# Patient Record
Sex: Male | Born: 2000 | Race: White | Hispanic: No | Marital: Single
Health system: Southern US, Community
[De-identification: ages and names within clinical notes are randomized; demographics above are authoritative.]

## PROBLEM LIST (undated history)

## (undated) DIAGNOSIS — F419 Anxiety disorder, unspecified: Secondary | ICD-10-CM

## (undated) DIAGNOSIS — F32A Depression, unspecified: Secondary | ICD-10-CM

## (undated) DIAGNOSIS — R4589 Other symptoms and signs involving emotional state: Secondary | ICD-10-CM

## (undated) HISTORY — PX: OTHER SURGICAL HISTORY: SHX169

## (undated) HISTORY — DX: Anxiety disorder, unspecified: F41.9

## (undated) HISTORY — DX: Depression, unspecified: F32.A

---
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
## 2018-12-21 ENCOUNTER — Ambulatory Visit (HOSPITAL_COMMUNITY)
Admission: EM | Admit: 2018-12-21 | Discharge: 2018-12-21 | Disposition: A | Discharge status: Home / Self Care | Payer: Self-pay

## 2018-12-21 NOTE — ED Notes (Signed)
Patient changed his mind and decided to leave.

## 2019-03-04 ENCOUNTER — Encounter (HOSPITAL_COMMUNITY): Payer: Self-pay | Admitting: Emergency Medicine

## 2019-03-04 ENCOUNTER — Other Ambulatory Visit: Payer: Self-pay

## 2019-03-04 ENCOUNTER — Ambulatory Visit (HOSPITAL_COMMUNITY)
Admission: EM | Admit: 2019-03-04 | Discharge: 2019-03-04 | Disposition: A | Discharge status: Home / Self Care | Payer: Medicaid Other | Attending: Family Medicine | Admitting: Family Medicine

## 2019-03-04 ENCOUNTER — Ambulatory Visit (INDEPENDENT_AMBULATORY_CARE_PROVIDER_SITE_OTHER): Payer: Self-pay

## 2019-03-04 DIAGNOSIS — R1010 Upper abdominal pain, unspecified: Secondary | ICD-10-CM

## 2019-03-04 LAB — POCT URINALYSIS DIP (DEVICE)
Bilirubin Urine: NEGATIVE
Glucose, UA: NEGATIVE mg/dL
Hgb urine dipstick: NEGATIVE
Ketones, ur: NEGATIVE mg/dL
Leukocytes,Ua: NEGATIVE
Nitrite: NEGATIVE
Protein, ur: NEGATIVE mg/dL
Specific Gravity, Urine: 1.02 (ref 1.005–1.030)
Urobilinogen, UA: 0.2 mg/dL (ref 0.0–1.0)
pH: 7 (ref 5.0–8.0)

## 2019-03-04 MED ORDER — DICYCLOMINE HCL 20 MG PO TABS: 20.0000 mg | ORAL_TABLET | Freq: Three times a day (TID) | ORAL | 0 refills | Status: DC

## 2019-03-04 MED ORDER — ONDANSETRON 4 MG PO TBDP: 4.0000 mg | ORAL_TABLET | Freq: Three times a day (TID) | ORAL | 0 refills | Status: DC | PRN

## 2019-03-04 NOTE — Discharge Instructions (Signed)
X-ray did not show significant amount of constipation. Please use Zofran as needed for nausea and vomiting Please use Bentyl before meals and bedtime as needed for abdominal cramping/pain Please follow-up with primary care/gastroenterology for further evaluation of recurrent episodes of abdominal pain. Please follow-up here in emergency room if developing worsening abdominal pain, fever

## 2019-03-04 NOTE — ED Provider Notes (Signed)
[facility]    CSN: [id number] Arrival date & time: 03/04/19  1241     History   Chief Complaint Chief Complaint  Patient presents with  . Abdominal Pain    HPI Riley Smith is a 18 y.o. male no significant past medical history presenting today for evaluation of abdominal pain nausea and vomiting.  Patient states that over the past couple of years he has had episodic episodes of abdominal pain nausea and vomiting.  He has had associated headaches as well.  Occasionally have diarrhea and/or constipation, but this is not a consistent symptom as the nausea and vomiting is.  He also notes that he has had arthralgias mainly in his elbows, shoulders neck and back associated with this as well.  States that he does feel he is noticed the abdominal pain, more so with greasy or fatty foods.  Notes the pain mainly to be more central/generalized, but also more prominent on the left side.  He denies radiation to his back or shoulder.  Has never had previous surgeries.  Denies urinary symptoms of dysuria, increased frequency or urgency.  Patient with sore throat related to vomiting, but denies associated URI symptoms.  Denies fevers.  Denies association with dairy foods.  HPI  History reviewed. No pertinent past medical history.  There are no active problems to display for this patient.   History reviewed. No pertinent surgical history.     Home Medications    Prior to Admission medications   Medication Sig Start Date End Date Taking? Authorizing Provider  dicyclomine (BENTYL) 20 MG tablet Take 1 tablet (20 mg total) by mouth 4 (four) times daily -  before meals and at bedtime. 03/04/19   Cinque Begley C, PA-C  ondansetron (ZOFRAN-ODT) 4 MG disintegrating tablet Take 1 tablet (4 mg total) by mouth every 8 (eight) hours as needed for nausea or vomiting. 03/04/19   Miyuki Rzasa, Junius CreamerHallie C, PA-C    Family History Family History  Problem Relation Age of Onset  . Healthy Mother   .  Healthy Father     Social History Social History   Tobacco Use  . Smoking status: Never Smoker  Substance Use Topics  . Alcohol use: Never    Frequency: Never  . Drug use: Never     Allergies   Patient has no known allergies.   Review of Systems Review of Systems  Constitutional: Negative for activity change, appetite change, chills, fatigue and fever.  HENT: Negative for congestion, ear pain, rhinorrhea, sinus pressure, sore throat and trouble swallowing.   Eyes: Negative for discharge and redness.  Respiratory: Negative for cough, chest tightness and shortness of breath.   Cardiovascular: Negative for chest pain.  Gastrointestinal: Positive for abdominal pain, nausea and vomiting. Negative for diarrhea.  Musculoskeletal: Negative for myalgias.  Skin: Negative for rash.  Neurological: Positive for headaches. Negative for dizziness and light-headedness.     Physical Exam Triage Vital Signs ED Triage Vitals  Enc Vitals Group     BP 03/04/19 1316 (!) 120/50     Pulse Rate 03/04/19 1316 (!) 108     Resp 03/04/19 1316 18     Temp 03/04/19 1316 98.9 F (37.2 C)     Temp Source 03/04/19 1316 Oral     SpO2 03/04/19 1316 100 %     Weight --      Height --      Head Circumference --      Peak Flow --  Pain Score 03/04/19 1310 4     Pain Loc --      Pain Edu? --      Excl. in [place]? --    No data found.  Updated Vital Signs BP (!) 120/50 (BP Location: Left Arm)   Pulse (!) 108   Temp 98.9 F (37.2 C) (Oral)   Resp 18   SpO2 100%   Visual Acuity Right Eye Distance:   Left Eye Distance:   Bilateral Distance:    Right Eye Near:   Left Eye Near:    Bilateral Near:     Physical Exam Vitals signs and nursing note reviewed.  Constitutional:      Appearance: He is well-developed.  HENT:     Head: Normocephalic and atraumatic.  Eyes:     Conjunctiva/sclera: Conjunctivae normal.  Neck:     Musculoskeletal: Neck supple.  Cardiovascular:     Rate and  Rhythm: Regular rhythm. Tachycardia present.     Heart sounds: No murmur.  Pulmonary:     Effort: Pulmonary effort is normal. No respiratory distress.     Breath sounds: Normal breath sounds.     Comments: Breathing comfortably at rest, CTABL, no wheezing, rales or other adventitious sounds auscultated Abdominal:     Palpations: Abdomen is soft.     Tenderness: There is abdominal tenderness.     Comments: Soft, nondistended, nontender throughout right upper and lower quadrants, mild tenderness to central upper abdomen and into the left upper quadrant.  No focal tenderness, negative rebound, negative Murphy's, negative McBurney's, negative Rovsing.  No CVA tenderness.  Musculoskeletal:     Comments: Moving all extremities appropriately, gait without abnormality  Skin:    General: Skin is warm and dry.  Neurological:     Mental Status: He is alert.      UC Treatments / Results  Labs (all labs ordered are listed, but only abnormal results are displayed) Labs Reviewed  POCT URINALYSIS DIP (DEVICE)    EKG None  Radiology Dg Abd 2 Views  Result Date: 03/04/2019 CLINICAL DATA:  Mid and upper abdominal pain for 1 week with nausea, vomiting and constipation. EXAM: ABDOMEN - 2 VIEW COMPARISON:  None. FINDINGS: The bowel gas pattern is normal. There is no evidence of free air. No radio-opaque calculi or other significant radiographic abnormality is seen. IMPRESSION: Negative exam. Electronically Signed   By: Inge Rise M.D.   On: 03/04/2019 14:19    Procedures Procedures (including critical care time)  Medications Ordered in UC Medications - No data to display  Initial Impression / Assessment and Plan / UC Course  I have reviewed the triage vital signs and the nursing notes.  Pertinent labs & imaging results that were available during my care of the patient were reviewed by me and considered in my medical decision making (see chart for details).    X-ray negative for  significant stool, does show stool within right colon, but no significant stool on left side.  Based off history and this, I feel symptoms less likely constipation related.  Discussed concern with potential gallbladder etiology given increase in symptoms with greasy/fatty foods.  Symptoms seem less likely reflux.  Chronic and recurrent in nature, less likely infectious cause.  Offered to obtain CMP/CBC/lipase to further evaluate, patient declined given his concern over needles.  Will treat symptomatically and supportively at this time, based off abdominal exam, negative for peritoneal signs, do not suspect abdominal emergency.  Will have follow-up with PCP/gastroenterology for further  evaluation and treatment.Discussed strict return precautions. Patient verbalized understanding and is agreeable with plan.   Final Clinical Impressions(s) / UC Diagnoses   Final diagnoses:  Pain of upper abdomen     Discharge Instructions     X-ray did not show significant amount of constipation. Please use Zofran as needed for nausea and vomiting Please use Bentyl before meals and bedtime as needed for abdominal cramping/pain Please follow-up with primary care/gastroenterology for further evaluation of recurrent episodes of abdominal pain. Please follow-up here in emergency room if developing worsening abdominal pain, fever    ED Prescriptions    Medication Sig Dispense Auth. Provider   ondansetron (ZOFRAN-ODT) 4 MG disintegrating tablet Take 1 tablet (4 mg total) by mouth every 8 (eight) hours as needed for nausea or vomiting. 24 tablet Tameyah Koch C, PA-C   dicyclomine (BENTYL) 20 MG tablet Take 1 tablet (20 mg total) by mouth 4 (four) times daily -  before meals and at bedtime. 30 tablet Teretha Chalupa, [place]ettaHallie C, PA-C     Controlled Substance Prescriptions [place] Controlled Substance Registry consulted? Not Applicable   Lew DawesWieters, Livy Ross C, [place]PA-C 03/04/19 1547

## 2019-03-04 NOTE — ED Triage Notes (Signed)
Started feeling sick one week ago.  Patient has abdominal pain, nausea, vomiting, headache.  Patient is concerned about joints popping ( chronic issue)

## 2019-03-22 ENCOUNTER — Other Ambulatory Visit: Payer: Self-pay

## 2019-03-22 ENCOUNTER — Emergency Department (HOSPITAL_COMMUNITY)
Admission: EM | Admit: 2019-03-22 | Discharge: 2019-03-22 | Disposition: A | Discharge status: Home / Self Care | Payer: Self-pay | Attending: Emergency Medicine | Admitting: Emergency Medicine

## 2019-03-22 ENCOUNTER — Emergency Department (HOSPITAL_COMMUNITY): Payer: Self-pay

## 2019-03-22 DIAGNOSIS — R109 Unspecified abdominal pain: Secondary | ICD-10-CM

## 2019-03-22 DIAGNOSIS — Z79899 Other long term (current) drug therapy: Secondary | ICD-10-CM | POA: Insufficient documentation

## 2019-03-22 DIAGNOSIS — R1115 Cyclical vomiting syndrome unrelated to migraine: Secondary | ICD-10-CM | POA: Insufficient documentation

## 2019-03-22 DIAGNOSIS — R1084 Generalized abdominal pain: Secondary | ICD-10-CM | POA: Insufficient documentation

## 2019-03-22 LAB — CBC WITH DIFFERENTIAL/PLATELET
Abs Immature Granulocytes: 0.02 10*3/uL (ref 0.00–0.07)
Basophils Absolute: 0 10*3/uL (ref 0.0–0.1)
Basophils Relative: 1 %
Eosinophils Absolute: 0.2 10*3/uL (ref 0.0–0.5)
Eosinophils Relative: 2 %
HCT: 46.2 % (ref 39.0–52.0)
Hemoglobin: 16.3 g/dL (ref 13.0–17.0)
Immature Granulocytes: 0 %
Lymphocytes Relative: 33 %
Lymphs Abs: 2.9 10*3/uL (ref 0.7–4.0)
MCH: 29.5 pg (ref 26.0–34.0)
MCHC: 35.3 g/dL (ref 30.0–36.0)
MCV: 83.7 fL (ref 80.0–100.0)
Monocytes Absolute: 0.9 10*3/uL (ref 0.1–1.0)
Monocytes Relative: 11 %
Neutro Abs: 4.6 10*3/uL (ref 1.7–7.7)
Neutrophils Relative %: 53 %
Platelets: 337 10*3/uL (ref 150–400)
RBC: 5.52 MIL/uL (ref 4.22–5.81)
RDW: 12.1 % (ref 11.5–15.5)
WBC: 8.6 10*3/uL (ref 4.0–10.5)
nRBC: 0 % (ref 0.0–0.2)

## 2019-03-22 LAB — URINALYSIS, COMPLETE (UACMP) WITH MICROSCOPIC
Bacteria, UA: NONE SEEN
Bilirubin Urine: NEGATIVE
Glucose, UA: NEGATIVE mg/dL
Hgb urine dipstick: NEGATIVE
Ketones, ur: NEGATIVE mg/dL
Leukocytes,Ua: NEGATIVE
Nitrite: NEGATIVE
Protein, ur: NEGATIVE mg/dL
Specific Gravity, Urine: 1.013 (ref 1.005–1.030)
pH: 6 (ref 5.0–8.0)

## 2019-03-22 LAB — COMPREHENSIVE METABOLIC PANEL
ALT: 23 U/L (ref 0–44)
AST: 22 U/L (ref 15–41)
Albumin: 4.4 g/dL (ref 3.5–5.0)
Alkaline Phosphatase: 66 U/L (ref 38–126)
Anion gap: 10 (ref 5–15)
BUN: 6 mg/dL (ref 6–20)
CO2: 25 mmol/L (ref 22–32)
Calcium: 9.4 mg/dL (ref 8.9–10.3)
Chloride: 103 mmol/L (ref 98–111)
Creatinine, Ser: 0.79 mg/dL (ref 0.61–1.24)
GFR calc Af Amer: >60 mL/min (ref >60)
GFR calc non Af Amer: >60 mL/min (ref >60)
Glucose, Bld: 99 mg/dL (ref 70–99)
Potassium: 3.8 mmol/L (ref 3.5–5.1)
Sodium: 138 mmol/L (ref 135–145)
Total Bilirubin: 1.1 mg/dL (ref 0.3–1.2)
Total Protein: 7.8 g/dL (ref 6.5–8.1)

## 2019-03-22 LAB — RAPID URINE DRUG SCREEN, HOSP PERFORMED
Amphetamines: NOT DETECTED
Barbiturates: NOT DETECTED
Benzodiazepines: NOT DETECTED
Cocaine: NOT DETECTED
Opiates: NOT DETECTED
Tetrahydrocannabinol: NOT DETECTED

## 2019-03-22 LAB — LIPASE, BLOOD: Lipase: 27 U/L (ref 11–51)

## 2019-03-22 MED ORDER — ONDANSETRON 4 MG PO TBDP: 4.0000 mg | ORAL_TABLET | Freq: Three times a day (TID) | ORAL | 0 refills | Status: DC | PRN

## 2019-03-22 NOTE — ED Notes (Signed)
Patient transported to Ultrasound 

## 2019-03-22 NOTE — ED Triage Notes (Signed)
Per pt he has been having vomiting abdominal pain for 3 weeks. Has been eating and drinking though. Able to hold food down but depend on the nausea he stated. Went to urgent care and was told if worse come to ed. Stomach pain is in the left side and right side

## 2019-03-22 NOTE — Discharge Instructions (Signed)
You were seen in the ER for evaluation of nausea, vomiting and ongoing abdominal pain for 3 weeks.  Lab work today was normal.  Ultrasound of your gallbladder was normal.    The cause of your symptoms is unclear.  It may be related to acid reflux.  I recommended daily antiacid medicine such as omeprazole 40 mg on an empty stomach.  Avoid greasy, fatty or spicy meals.  Eat small meals every 3-4 hours instead of 1 large meal once or twice a day.  Avoid any ibuprofen containing products, aspirin, alcohol, cigarettes.  You can take Tylenol for pain.  You have a prescription for Zofran for nausea to take as needed.  Stay hydrated.    Follow-up with your primary care doctor or gastroenterology for further evaluation of your ongoing symptoms.  Return to the ER for fever greater than 100, blood in your vomit, diarrhea, constipation, blood in your stools, localized right upper abdominal pain or right lower abdominal pain, urinary symptoms.

## 2019-03-22 NOTE — ED Provider Notes (Signed)
[facility]   CSN: [id number] Arrival date & time: 03/22/19  0749    History   Chief Complaint Chief Complaint  Patient presents with  . Abdominal Pain    HPI Riley Smith is a 18 y.o. male presents to the ER for evaluation of persistent nausea associated with vomiting and abdominal pain.  Onset 3 weeks ago.  The abdominal pain is described as right-sided and left-sided, intermittently switching between sides.  The pain in the right side is in the right mid abdomen and feels stabbing/cramping.  The pain on the left side is in the left upper abdomen and described as cramping.  Currently has no pain.  When he gets pain it usually last 10 to 15 minutes and sometimes comes intermittently throughout the day.  Last time he had pain was yesterday evening before eating dinner.  The pain is usually mild to moderate.  The pain will happen on an empty stomach but also sometimes after eating.  No pattern to the pain otherwise.  For the last few weeks he has had about 1-2 episodes of nonbloody nonbilious emesis daily.  His symptoms severity depend on the day symptoms milder and sometimes more intense.  He went to urgent care in 6/22 and had an x-ray and urinalysis and was instructed to take Zofran and Bentyl and follow-up with PCP and gastroenterologist but patient has not taken the medicines or followed up as outpatient.  He is here because his pain has persisted, not necessarily any worse.  He is tried antacids and Tylenol/ibuprofen with very mild temporary relief.  His stool looks a little bit darker but nonbloody and non-melanotic, formed.  When he takes ibuprofen/Tylenol it is very occasionally and not daily.  No history of abdominal surgeries.  No past medical history of daily medicines.  No cigarette use.  No marijuana use.  No associated fever, chills, hematemesis, diarrhea, constipation, urinary symptoms.    HPI  No past medical history on  file.  There are no active problems to display for this patient.   No past surgical history on file.      Home Medications    Prior to Admission medications   Medication Sig Start Date End Date Taking? Authorizing Provider  dicyclomine (BENTYL) 20 MG tablet Take 1 tablet (20 mg total) by mouth 4 (four) times daily -  before meals and at bedtime. 03/04/19   Wieters, Hallie C, PA-C  ondansetron (ZOFRAN-ODT) 4 MG disintegrating tablet Take 1 tablet (4 mg total) by mouth every 8 (eight) hours as needed for nausea or vomiting. 03/22/19   Liberty HandyGibbons, Preciosa Bundrick J, PA-C    Family History Family History  Problem Relation Age of Onset  . Healthy Mother   . Healthy Father     Social History Social History   Tobacco Use  . Smoking status: Never Smoker  Substance Use Topics  . Alcohol use: Never    Frequency: Never  . Drug use: Never     Allergies   Patient has no known allergies.   Review of Systems Review of Systems  Gastrointestinal: Positive for abdominal pain, nausea and vomiting.  All other systems reviewed and are negative.    Physical Exam Updated Vital Signs BP 125/67 (BP Location: Right Arm)   Pulse 87   Temp 98.2 F (36.8 C) (Oral)   Resp 16   SpO2 98%   Physical Exam Vitals signs and nursing note reviewed.  Constitutional:  Appearance: He is well-developed.     Comments: Non toxic.  HENT:     Head: Normocephalic and atraumatic.     Nose: Nose normal.  Eyes:     Conjunctiva/sclera: Conjunctivae normal.  Neck:     Musculoskeletal: Normal range of motion.  Cardiovascular:     Rate and Rhythm: Normal rate and regular rhythm.     Heart sounds: Normal heart sounds.  Pulmonary:     Effort: Pulmonary effort is normal.     Breath sounds: Normal breath sounds.  Abdominal:     General: Bowel sounds are normal.     Palpations: Abdomen is soft.     Tenderness: There is abdominal tenderness.       Comments: Very mild right mid abdomen and LUQ abdominal  pain. No G/R/R. No suprapubic or CVA tenderness. Negative Murphy's and McBurney's. Active BS.   Musculoskeletal: Normal range of motion.  Skin:    General: Skin is warm and dry.     Capillary Refill: Capillary refill takes less than 2 seconds.  Neurological:     Mental Status: He is alert.  Psychiatric:        Behavior: Behavior normal.      ED Treatments / Results  Labs (all labs ordered are listed, but only abnormal results are displayed) Labs Reviewed  CBC WITH DIFFERENTIAL/PLATELET  COMPREHENSIVE METABOLIC PANEL  LIPASE, BLOOD  URINALYSIS, COMPLETE (UACMP) WITH MICROSCOPIC  RAPID URINE DRUG SCREEN, HOSP PERFORMED    EKG None  Radiology [place] Abdomen Limited Ruq  Result Date: 03/22/2019 CLINICAL DATA:  Right upper quadrant pain with vomiting EXAM: ULTRASOUND ABDOMEN LIMITED RIGHT UPPER QUADRANT COMPARISON:  None. FINDINGS: Gallbladder: No gallstones or wall thickening visualized. There is no pericholecystic fluid. No sonographic Murphy sign noted by sonographer. Common bile duct: Diameter: 2 mm. No intrahepatic or extrahepatic biliary duct dilatation. Liver: No focal lesion identified. Within normal limits in parenchymal echogenicity. Portal vein is patent on color Doppler imaging with normal direction of blood flow towards the liver. IMPRESSION: Study within normal limits. Electronically Signed   By: Bretta BangWilliam  Woodruff III M.D.   On: 03/22/2019 09:45    Procedures Procedures (including critical care time)  Medications Ordered in ED Medications - No data to display   Initial Impression / Assessment and Plan / ED Course  I have reviewed the triage vital signs and the nursing notes.  Pertinent labs & imaging results that were available during my care of the patient were reviewed by me and considered in my medical decision making (see chart for details).  18 yo here for chronic abdominal pain, nausea, vomiting. No fever, hematemesis, changes in BM, urinary symptoms, h/o  abdominal surgeries.   ddx includes viral process vs GERD vs gastritis.   Labs today reviewed and interpreted by me. No leukocytosis, AKI, transaminitis. Lipase normal. No UTI or RBCs in urine. UDS negative for THC. Given right mid abdomen pain and cyclical n/v RUQ US obtained which was normal.    Pt had no pain in ER. I re-evaluated pt and no return of pain, nausea, vomiting.   Given chronicity of symptoms, benign exam, labs today doubt intraabdominal emergency such as cholecystitis, appendicitis, diverticulitis, SBO.    Will dc with omeprazole, zofran, diet changes and GI f/u. Return precautions given. Pt in agreement with ER work up and disposition.  Final Clinical Impressions(s) / ED Diagnoses   Final diagnoses:  Abdominal cramping  Non-intractable cyclical vomiting with nausea    ED Discharge Orders  Ordered    ondansetron (ZOFRAN-ODT) 4 MG disintegrating tablet  Every 8 hours PRN     03/22/19 0953           Kinnie Feil, PA-C 03/22/19 3343    Isla Pence, MD 03/22/19 1001

## 2019-12-19 ENCOUNTER — Ambulatory Visit: Payer: Medicaid Other | Attending: Internal Medicine

## 2019-12-19 DIAGNOSIS — Z23 Encounter for immunization: Secondary | ICD-10-CM

## 2019-12-19 NOTE — Progress Notes (Signed)
   Covid-19 Vaccination Clinic  Name:  Riley Smith    MRN: [id number] DOB: 09-Jan-2001  12/19/2019  [name] was observed post Covid-19 immunization for 15 minutes without incident. He was provided with Vaccine Information Sheet and instruction to access the V-Safe system.   [name] was instructed to call 911 with any severe reactions post vaccine: Marland Kitchen Difficulty breathing  . Swelling of face and throat  . A fast heartbeat  . A bad rash all over body  . Dizziness and weakness   Immunizations Administered    Name Date Dose VIS Date Route   Pfizer COVID-19 Vaccine 12/19/2019 10:14 AM 0.3 mL 08/23/2019 Intramuscular   Manufacturer: [employer], [employer]   Lot: [id number]   NDC: [address]-8

## 2020-01-13 ENCOUNTER — Ambulatory Visit: Payer: Medicaid Other | Attending: Internal Medicine

## 2020-01-13 DIAGNOSIS — Z23 Encounter for immunization: Secondary | ICD-10-CM

## 2020-01-13 NOTE — Progress Notes (Signed)
   Covid-19 Vaccination Clinic  Name:  Riley Smith    MRN: [id number] DOB: 09/10/2001  01/13/2020  Mr. Riley Smith was observed post Covid-19 immunization for 15 minutes without incident. He was provided with Vaccine Information Sheet and instruction to access the V-Safe system.   Mr. Riley Smith was instructed to call 911 with any severe reactions post vaccine: Marland Kitchen Difficulty breathing  . Swelling of face and throat  . A fast heartbeat  . A bad rash all over body  . Dizziness and weakness   Immunizations Administered    Name Date Dose VIS Date Route   Pfizer COVID-19 Vaccine 01/13/2020 10:05 AM 0.3 mL 11/06/2018 Intramuscular   Manufacturer: [employer], [employer]   Lot: [id number]   NDC: [address]-7

## 2020-05-09 IMAGING — DX ABDOMEN - 2 VIEW
3 series · 3 of 3 positions shown · non-contrast
Comparison: None.

CLINICAL DATA: Mid and upper abdominal pain for 1 week with nausea,
vomiting and constipation.

EXAM:
ABDOMEN - 2 VIEW

[abdomen erect (1 of 2)]
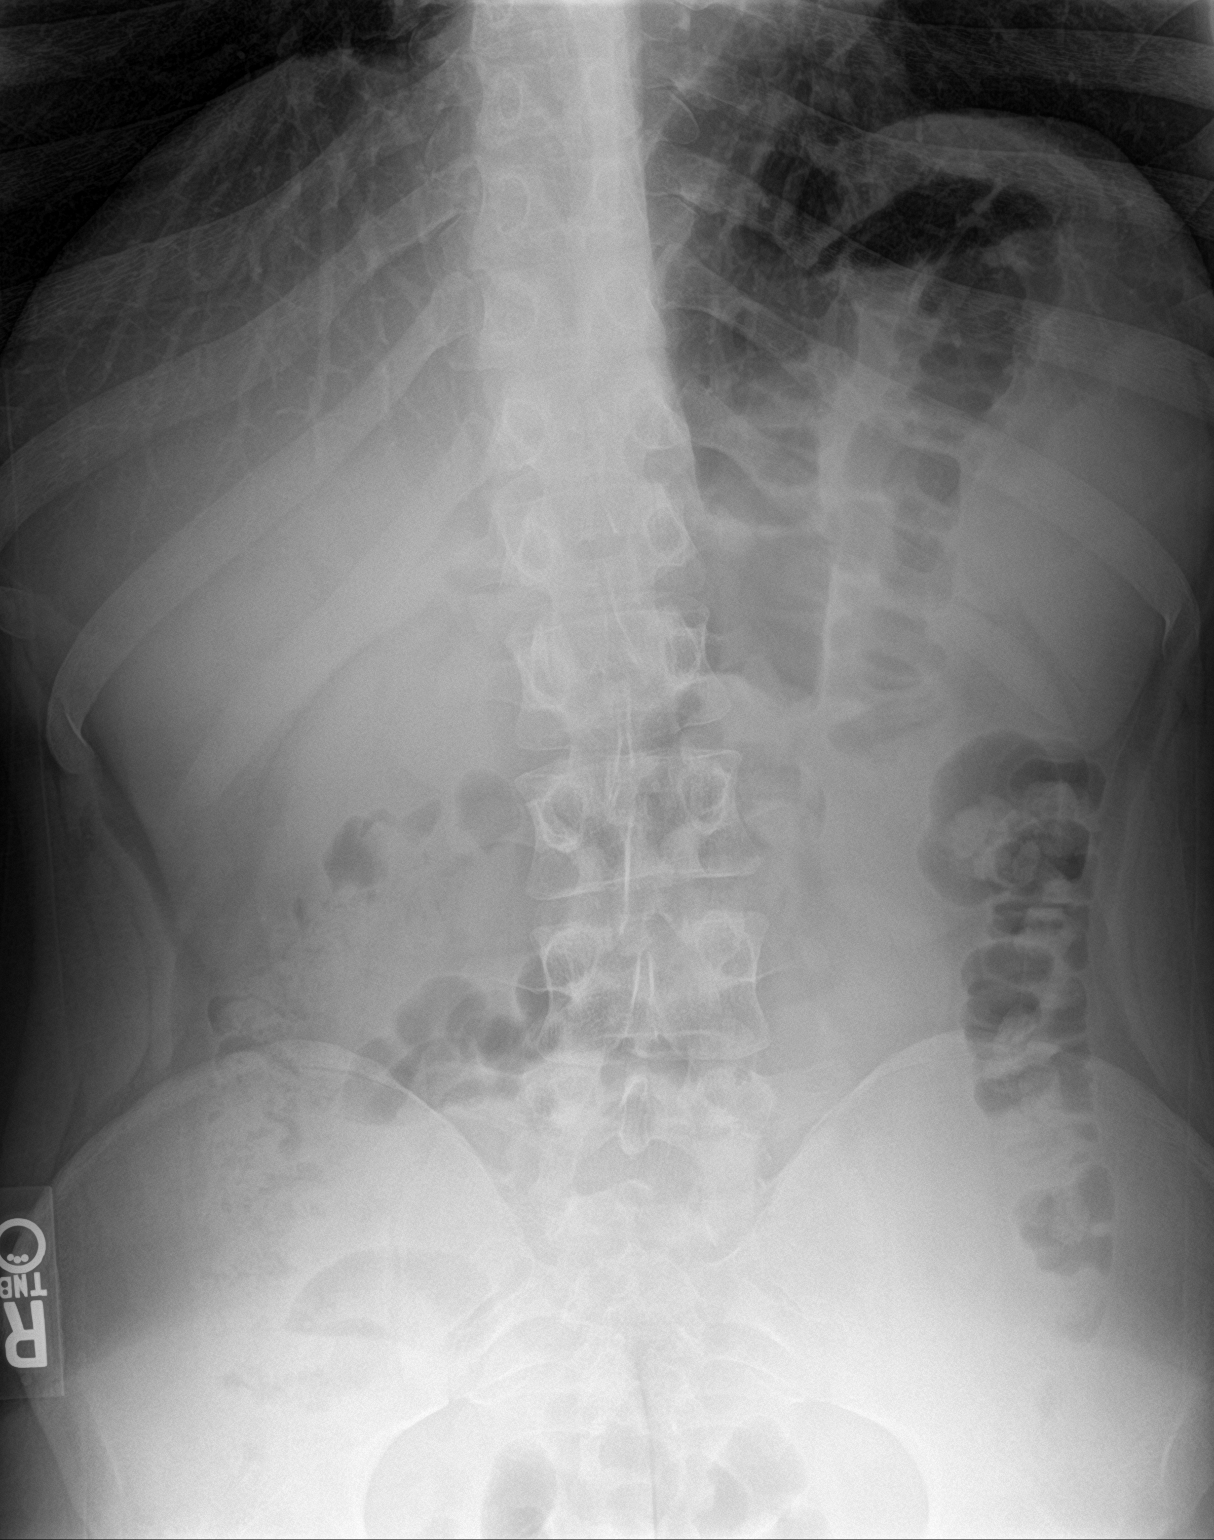

[abdomen supine]
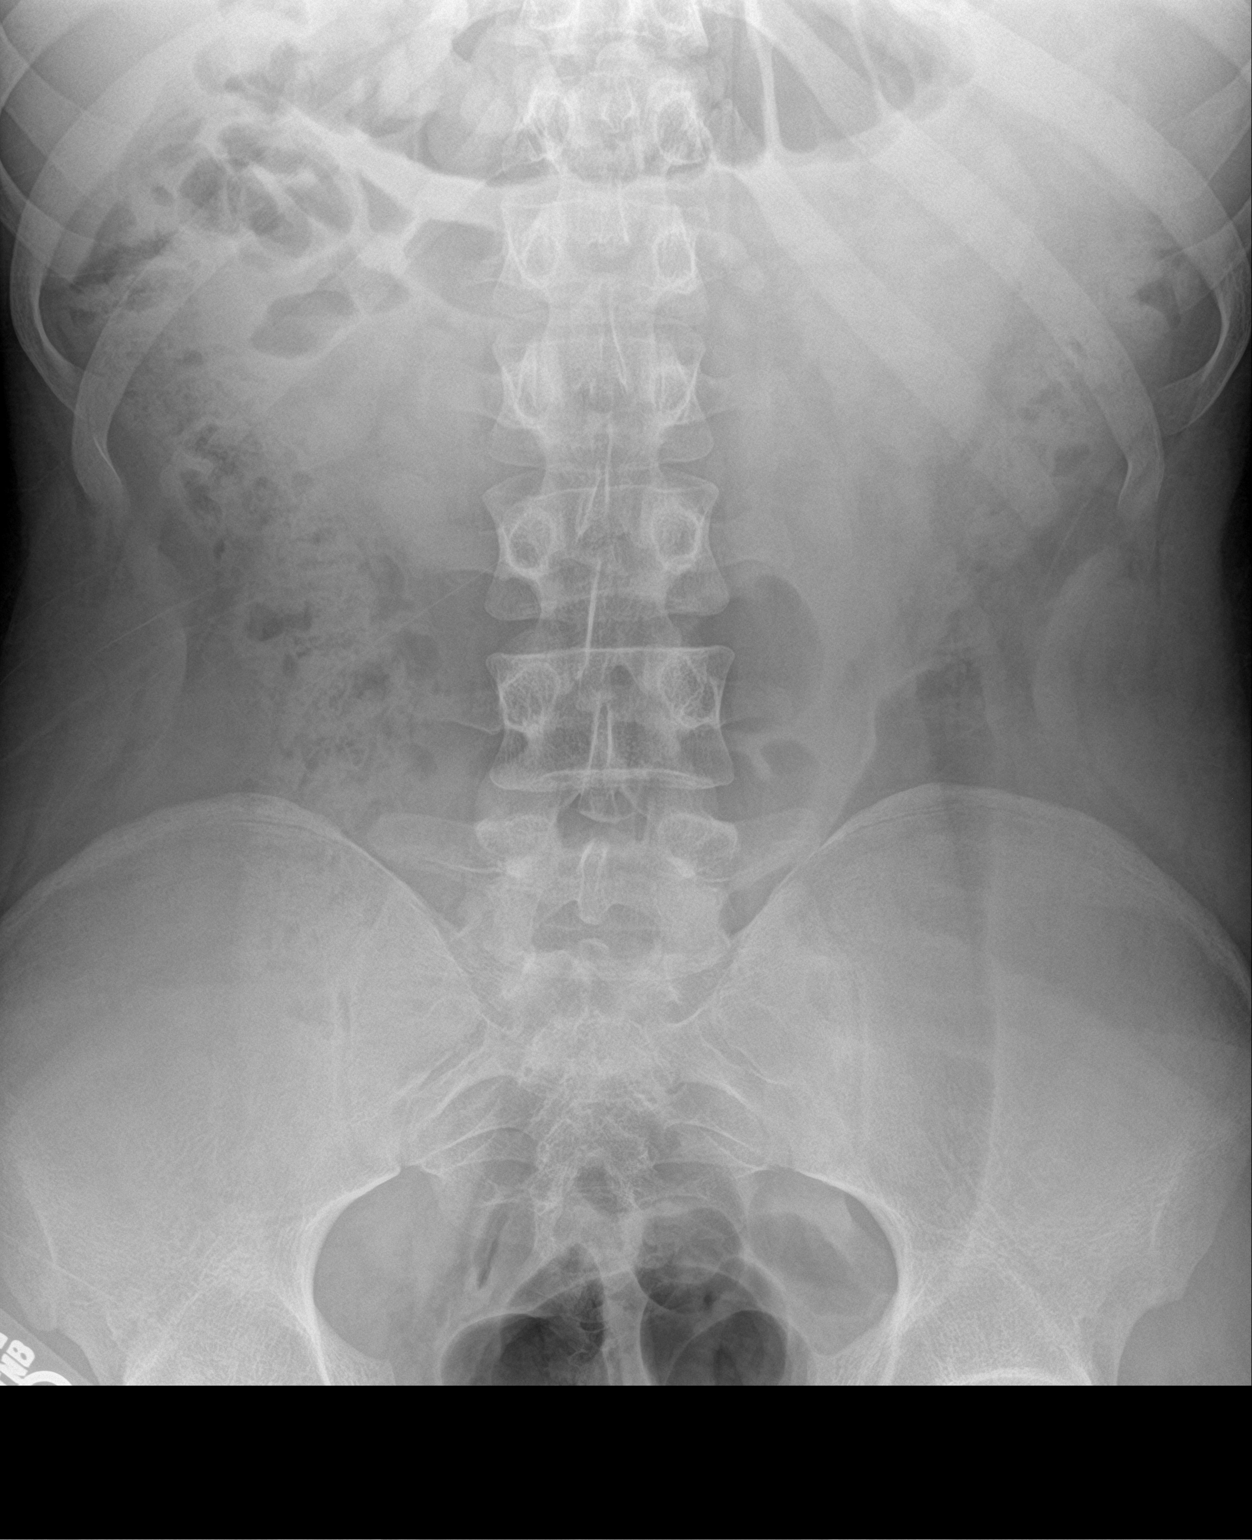

[abdomen erect (2 of 2)]
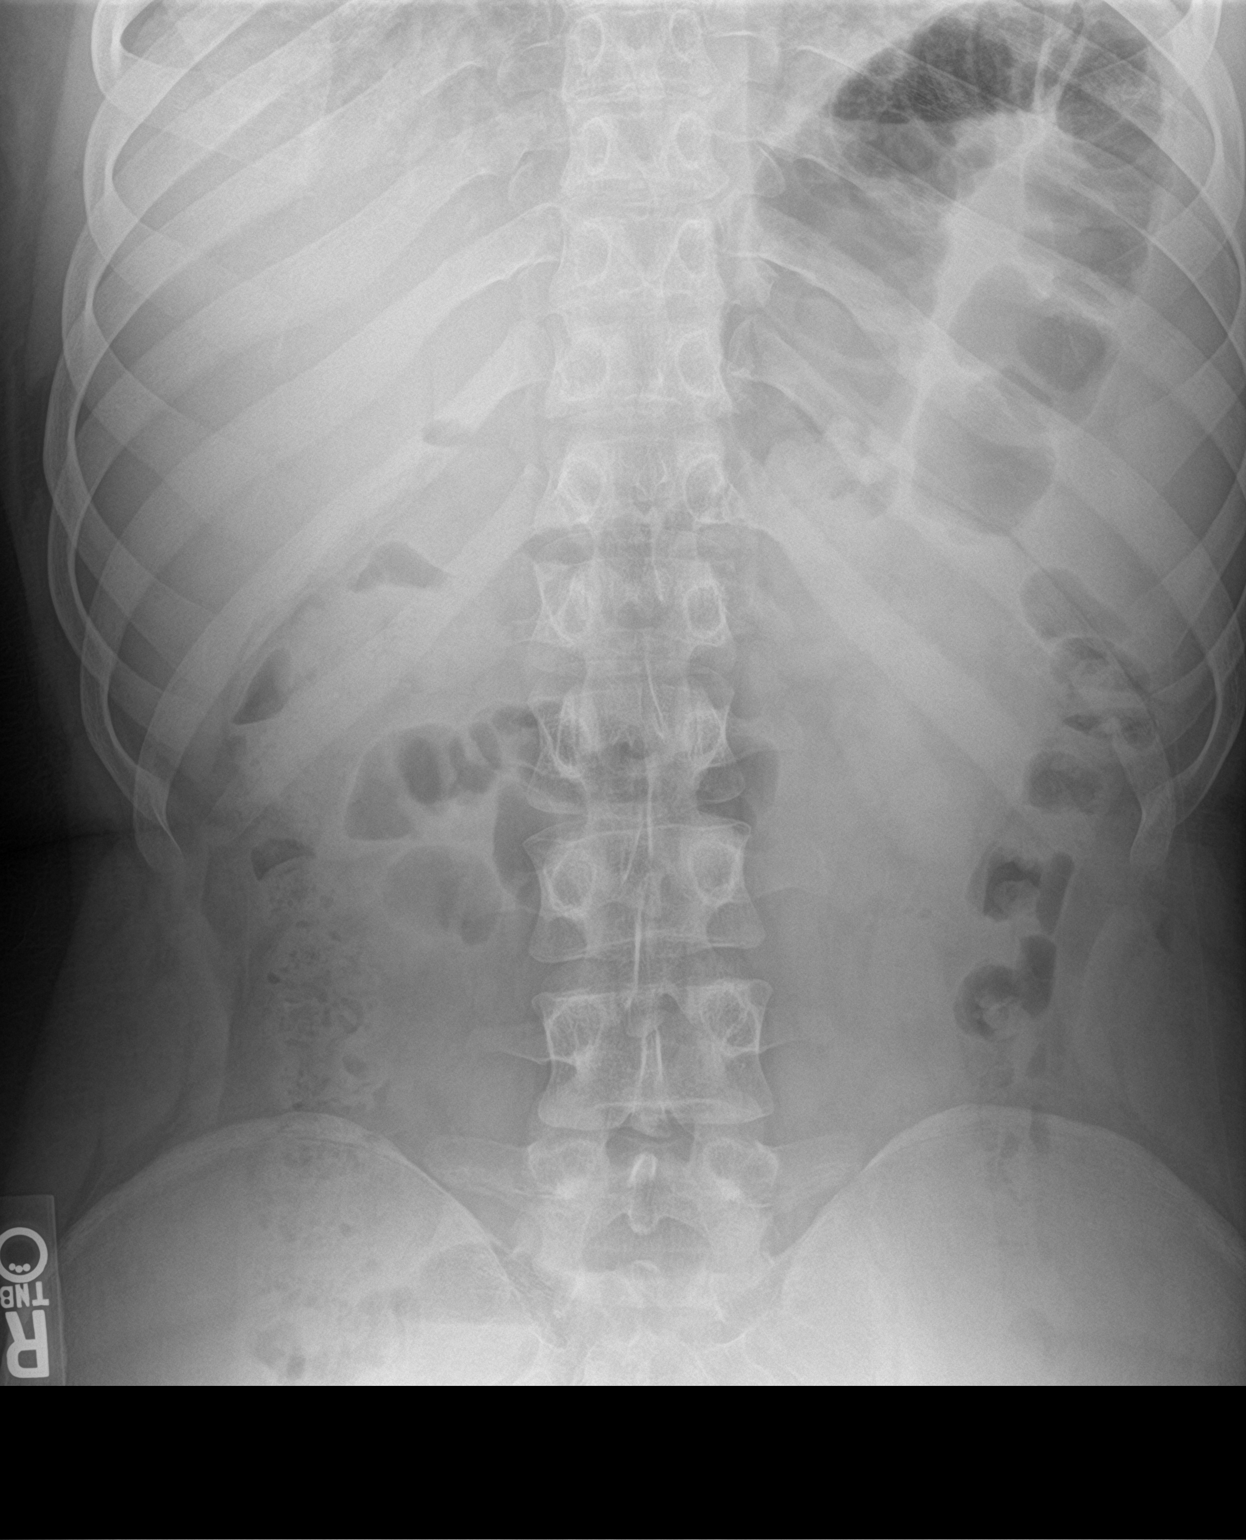

[3 of 3 positions shown; findings below may reference images not displayed]

FINDINGS: The bowel gas pattern is normal. There is no evidence of free air.
No radio-opaque calculi or other significant radiographic
abnormality is seen.
IMPRESSION: Negative exam.

## 2020-07-01 ENCOUNTER — Other Ambulatory Visit: Payer: Self-pay

## 2020-07-01 ENCOUNTER — Emergency Department (HOSPITAL_COMMUNITY)
Admission: EM | Admit: 2020-07-01 | Discharge: 2020-07-02 | Disposition: A | Discharge status: Home / Self Care | Payer: Medicaid Other | Attending: Emergency Medicine | Admitting: Emergency Medicine

## 2020-07-01 DIAGNOSIS — Z20822 Contact with and (suspected) exposure to covid-19: Secondary | ICD-10-CM | POA: Insufficient documentation

## 2020-07-01 DIAGNOSIS — X789XXA Intentional self-harm by unspecified sharp object, initial encounter: Secondary | ICD-10-CM | POA: Insufficient documentation

## 2020-07-01 DIAGNOSIS — R45851 Suicidal ideations: Secondary | ICD-10-CM | POA: Insufficient documentation

## 2020-07-01 DIAGNOSIS — T50902A Poisoning by unspecified drugs, medicaments and biological substances, intentional self-harm, initial encounter: Secondary | ICD-10-CM

## 2020-07-01 LAB — CBC WITH DIFFERENTIAL/PLATELET
Abs Immature Granulocytes: 0.02 10*3/uL (ref 0.00–0.07)
Basophils Absolute: 0.1 10*3/uL (ref 0.0–0.1)
Basophils Relative: 1 %
Eosinophils Absolute: 0.1 10*3/uL (ref 0.0–0.5)
Eosinophils Relative: 1 %
HCT: 48.1 % (ref 39.0–52.0)
Hemoglobin: 16.6 g/dL (ref 13.0–17.0)
Immature Granulocytes: 0 %
Lymphocytes Relative: 17 %
Lymphs Abs: 1.5 10*3/uL (ref 0.7–4.0)
MCH: 29.7 pg (ref 26.0–34.0)
MCHC: 34.5 g/dL (ref 30.0–36.0)
MCV: 86 fL (ref 80.0–100.0)
Monocytes Absolute: 1 10*3/uL (ref 0.1–1.0)
Monocytes Relative: 11 %
Neutro Abs: 6.5 10*3/uL (ref 1.7–7.7)
Neutrophils Relative %: 70 %
Platelets: 339 10*3/uL (ref 150–400)
RBC: 5.59 MIL/uL (ref 4.22–5.81)
RDW: 12.6 % (ref 11.5–15.5)
WBC: 9.2 10*3/uL (ref 4.0–10.5)
nRBC: 0 % (ref 0.0–0.2)

## 2020-07-01 LAB — COMPREHENSIVE METABOLIC PANEL
ALT: 18 U/L (ref 0–44)
AST: 18 U/L (ref 15–41)
Albumin: 4.9 g/dL (ref 3.5–5.0)
Alkaline Phosphatase: 58 U/L (ref 38–126)
Anion gap: 9 (ref 5–15)
BUN: 8 mg/dL (ref 6–20)
CO2: 28 mmol/L (ref 22–32)
Calcium: 9.7 mg/dL (ref 8.9–10.3)
Chloride: 102 mmol/L (ref 98–111)
Creatinine, Ser: 0.94 mg/dL (ref 0.61–1.24)
GFR, Estimated: >60 mL/min (ref >60)
Glucose, Bld: 103 mg/dL — ABNORMAL HIGH (ref 70–99)
Potassium: 4 mmol/L (ref 3.5–5.1)
Sodium: 139 mmol/L (ref 135–145)
Total Bilirubin: 1.2 mg/dL (ref 0.3–1.2)
Total Protein: 8.6 g/dL — ABNORMAL HIGH (ref 6.5–8.1)

## 2020-07-01 LAB — SALICYLATE LEVEL: Salicylate Lvl: <7 mg/dL — ABNORMAL LOW (ref 7.0–30.0)

## 2020-07-01 LAB — RAPID URINE DRUG SCREEN, HOSP PERFORMED
Amphetamines: NOT DETECTED
Barbiturates: NOT DETECTED
Benzodiazepines: NOT DETECTED
Cocaine: NOT DETECTED
Opiates: NOT DETECTED
Tetrahydrocannabinol: NOT DETECTED

## 2020-07-01 LAB — RESPIRATORY PANEL BY RT PCR (FLU A&B, COVID)
Influenza A by PCR: NEGATIVE
Influenza B by PCR: NEGATIVE
SARS Coronavirus 2 by RT PCR: NEGATIVE

## 2020-07-01 LAB — ACETAMINOPHEN LEVEL: Acetaminophen (Tylenol), Serum: <10 ug/mL — ABNORMAL LOW (ref 10–30)

## 2020-07-01 LAB — ETHANOL: Alcohol, Ethyl (B): <10 mg/dL (ref <10)

## 2020-07-01 MED ORDER — ONDANSETRON 4 MG PO TBDP
4.0000 mg | ORAL_TABLET | Freq: Once | ORAL | Status: AC
  Administered 2020-07-01: 4 mg via ORAL
  Filled 2020-07-01: qty 1

## 2020-07-01 NOTE — ED Notes (Signed)
EKG completed and given to Dr. Lockwood. 

## 2020-07-01 NOTE — ED Triage Notes (Signed)
Pt called crisis law enforcement seeking help for feeling suicidal and low enforcement called EMS.  Voluntary. Took 8 benadryl and 8 ibuprofen. In a suicide attempt. He also cut his arms superficially.  Currently denies SI but is depressed. Lost his job and lost his girlfriend. He was not accepted for financial aide for school because he does not have a job.   On admission pt is cooperative with flat affect.

## 2020-07-01 NOTE — ED Provider Notes (Signed)
[facility]-EMERGENCY DEPT Provider Note   CSN: [id number] Arrival date & time: 07/01/20  1735     History Chief Complaint  Patient presents with  . Medical Clearance    Riley Smith is a 19 y.o. male.  HPI    Patient presents with concern of depression, suicidal ideation after suicide attempt yesterday.  Patient notes a history of depression, takes no medication regularly for this.  He does not smoke, does not drink, denies drug use. He notes that he recently lost his job, and with increasing feelings of depression, yesterday took approximately 8 ibuprofen and 8 Sleep-Aid tablets.  Yesterday he also made several self harming gestures, including both of his wrists. He notes 1 prior episode 2 cutting, that occurred 1 week ago, and those wounds were healing well until yesterday.  Today, with some remorse over his suicide attempt, he presents for evaluation.  He is suicidal ideation is currently minimal. He denies physical pain, dyspnea, nausea, or physiologic complaints. No past medical history on file.  There are no problems to display for this patient.   No past surgical history on file.     Family History  Problem Relation Age of Onset  . Healthy Mother   . Healthy Father     Social History   Tobacco Use  . Smoking status: Never Smoker  Substance Use Topics  . Alcohol use: Never  . Drug use: Never    Home Medications Prior to Admission medications   Medication Sig Start Date End Date Taking? Authorizing Provider  dicyclomine (BENTYL) 20 MG tablet Take 1 tablet (20 mg total) by mouth 4 (four) times daily -  before meals and at bedtime. 03/04/19   Wieters, Hallie C, PA-C  ondansetron (ZOFRAN-ODT) 4 MG disintegrating tablet Take 1 tablet (4 mg total) by mouth every 8 (eight) hours as needed for nausea or vomiting. 03/22/19   Liberty Handy, PA-C    Allergies    Patient has no known allergies.  Review of Systems   Review of Systems    Constitutional:       Per HPI, otherwise negative  HENT:       Per HPI, otherwise negative  Respiratory:       Per HPI, otherwise negative  Cardiovascular:       Per HPI, otherwise negative  Gastrointestinal: Negative for vomiting.  Endocrine:       Negative aside from HPI  Genitourinary:       Neg aside from HPI   Musculoskeletal:       Per HPI, otherwise negative  Skin: Positive for wound.  Neurological: Negative for syncope.  Psychiatric/Behavioral: Positive for dysphoric mood and suicidal ideas.    Physical Exam Updated Vital Signs There were no vitals taken for this visit.  Physical Exam Vitals and nursing note reviewed.  Constitutional:      General: He is not in acute distress.    Appearance: He is well-developed.  HENT:     Head: Normocephalic and atraumatic.  Eyes:     Conjunctiva/sclera: Conjunctivae normal.  Pulmonary:     Effort: Pulmonary effort is normal. No respiratory distress.     Breath sounds: No stridor.  Abdominal:     General: There is no distension.  Skin:    General: Skin is warm and dry.       Neurological:     Mental Status: He is alert and oriented to person, place, and time.  Psychiatric:  Thought Content: Thought content includes suicidal ideation.        Cognition and Memory: Cognition is not impaired. Memory is not impaired.     ED Results / Procedures / Treatments   Labs (all labs ordered are listed, but only abnormal results are displayed) Labs Reviewed  COMPREHENSIVE METABOLIC PANEL - Abnormal; Notable for the following components:      Result Value   Glucose, Bld 103 (*)    Total Protein 8.6 (*)    All other components within normal limits  SALICYLATE LEVEL - Abnormal; Notable for the following components:   Salicylate Lvl <7.0 (*)    All other components within normal limits  ACETAMINOPHEN LEVEL - Abnormal; Notable for the following components:   Acetaminophen (Tylenol), Serum <10 (*)    All other components  within normal limits  RESPIRATORY PANEL BY RT PCR (FLU A&B, COVID)  ETHANOL  CBC WITH DIFFERENTIAL/PLATELET  RAPID URINE DRUG SCREEN, HOSP PERFORMED    EKG EKG Interpretation  Date/Time:  Wednesday July 01 2020 18:33:29 EDT Ventricular Rate:  87 PR Interval:  140 QRS Duration: 88 QT Interval:  326 QTC Calculation: 392 R Axis:   51 Text Interpretation: Normal sinus rhythm Normal ECG Confirmed by Gerhard Munch [phone]) on 07/01/2020 6:47:34 PM   Radiology No results found.  Procedures Procedures (including critical care time)  Medications Ordered in ED Medications  ondansetron (ZOFRAN-ODT) disintegrating tablet 4 mg (has no administration in time range)    ED Course  I have reviewed the triage vital signs and the nursing notes.  Pertinent labs & imaging results that were available during my care of the patient were reviewed by me and considered in my medical decision making (see chart for details).     Adult male with history of depression presents after suicide attempt yesterday.  Given the patient's disenfranchised status, loss of job, per nursing report loss of girlfriend, depression, he has elevated risk profile for suicide.  Patient's wounds from yesterday's attempt are unremarkable, labs, EKG unremarkable, he has no hemodynamic instability, he is medically cleared for behavioral health evaluation.  Final Clinical Impression(s) / ED Diagnoses Final diagnoses:  Intentional drug overdose, initial encounter (HCC)  Suicidal ideation     Gerhard Munch, MD 07/01/20 [phone]

## 2020-07-01 NOTE — ED Notes (Signed)
Urine cup given to pt.

## 2020-07-02 ENCOUNTER — Inpatient Hospital Stay (HOSPITAL_COMMUNITY)
Admission: RE | Admit: 2020-07-02 | Discharge: 2020-07-04 | DRG: 885 | Disposition: A | Discharge status: Home / Self Care | Payer: Federal, State, Local not specified - Other | Source: Intra-hospital | Attending: Psychiatry | Admitting: Psychiatry

## 2020-07-02 ENCOUNTER — Encounter (HOSPITAL_COMMUNITY): Payer: Self-pay | Admitting: Psychiatry

## 2020-07-02 ENCOUNTER — Other Ambulatory Visit: Payer: Self-pay

## 2020-07-02 DIAGNOSIS — F411 Generalized anxiety disorder: Secondary | ICD-10-CM | POA: Diagnosis present

## 2020-07-02 DIAGNOSIS — F332 Major depressive disorder, recurrent severe without psychotic features: Secondary | ICD-10-CM | POA: Diagnosis not present

## 2020-07-02 DIAGNOSIS — K219 Gastro-esophageal reflux disease without esophagitis: Secondary | ICD-10-CM | POA: Diagnosis present

## 2020-07-02 DIAGNOSIS — Z9151 Personal history of suicidal behavior: Secondary | ICD-10-CM

## 2020-07-02 DIAGNOSIS — Z560 Unemployment, unspecified: Secondary | ICD-10-CM | POA: Diagnosis not present

## 2020-07-02 DIAGNOSIS — T1491XA Suicide attempt, initial encounter: Secondary | ICD-10-CM | POA: Diagnosis present

## 2020-07-02 LAB — LIPID PANEL
Cholesterol: 184 mg/dL (ref 0–200)
HDL: 44 mg/dL (ref >40)
LDL Cholesterol: 99 mg/dL (ref 0–99)
Total CHOL/HDL Ratio: 4.2 RATIO
Triglycerides: 207 mg/dL — ABNORMAL HIGH (ref <150)
VLDL: 41 mg/dL — ABNORMAL HIGH (ref 0–40)

## 2020-07-02 LAB — TSH: TSH: 4.57 u[IU]/mL — ABNORMAL HIGH (ref 0.350–4.500)

## 2020-07-02 LAB — T4, FREE: Free T4: 0.65 ng/dL (ref 0.61–1.12)

## 2020-07-02 MED ORDER — FAMOTIDINE 20 MG PO TABS
20.0000 mg | ORAL_TABLET | Freq: Two times a day (BID) | ORAL | Status: DC
  Administered 2020-07-02 – 2020-07-04 (×4): 20 mg via ORAL
  Filled 2020-07-02 (×2): qty 1
  Filled 2020-07-02: qty 14
  Filled 2020-07-02 (×2): qty 1
  Filled 2020-07-02 (×2): qty 14
  Filled 2020-07-02 (×3): qty 1
  Filled 2020-07-02: qty 14

## 2020-07-02 MED ORDER — ONDANSETRON HCL 4 MG PO TABS: 4.0000 mg | ORAL_TABLET | Freq: Three times a day (TID) | ORAL | Status: DC | PRN

## 2020-07-02 MED ORDER — MAGNESIUM HYDROXIDE 400 MG/5ML PO SUSP: 30.0000 mL | Freq: Every day | ORAL | Status: DC | PRN

## 2020-07-02 MED ORDER — TRAZODONE HCL 50 MG PO TABS
50.0000 mg | ORAL_TABLET | Freq: Every evening | ORAL | Status: DC | PRN
  Administered 2020-07-02: 50 mg via ORAL
  Filled 2020-07-02: qty 7
  Filled 2020-07-02: qty 1

## 2020-07-02 MED ORDER — BUPROPION HCL ER (XL) 150 MG PO TB24
150.0000 mg | ORAL_TABLET | Freq: Every day | ORAL | Status: DC
  Administered 2020-07-02 – 2020-07-04 (×3): 150 mg via ORAL
  Filled 2020-07-02 (×2): qty 1
  Filled 2020-07-02: qty 7
  Filled 2020-07-02 (×2): qty 1
  Filled 2020-07-02: qty 7
  Filled 2020-07-02: qty 1

## 2020-07-02 MED ORDER — ACETAMINOPHEN 325 MG PO TABS: 650.0000 mg | ORAL_TABLET | Freq: Four times a day (QID) | ORAL | Status: DC | PRN

## 2020-07-02 MED ORDER — HYDROXYZINE HCL 25 MG PO TABS
25.0000 mg | ORAL_TABLET | Freq: Three times a day (TID) | ORAL | Status: DC | PRN
  Administered 2020-07-02: 25 mg via ORAL
  Filled 2020-07-02: qty 1
  Filled 2020-07-02: qty 10

## 2020-07-02 MED ORDER — ALUM & MAG HYDROXIDE-SIMETH 200-200-20 MG/5ML PO SUSP: 30.0000 mL | ORAL | Status: DC | PRN
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
## 2020-07-02 NOTE — ED Notes (Signed)
Safe transport contacted for transfer

## 2020-07-02 NOTE — [facility] Group Notes (Signed)
Occupational Therapy Group Note Date: 07/02/2020 Group Topic/Focus: Communication Skills  Group Description: Group encouraged increased engagement and participation through discussion focused on identifying and 'breaking down barriers." Group members were encouraged to complete a worksheet and engage in discussion identifying barriers that get in the way of seeking treatment, asking for help, and managing our mental health.  Participation Level: Active   Participation Quality: Independent   Behavior: Calm and Cooperative   Speech/Thought Process: Focused   Affect/Mood: Euthymic   Insight: Fair   Judgement: Fair   Individualization: Riley Smith was active and independent in his participation of discussion/activity. Pt was largely an observer, however engaged in discussion as he related to peers. Pt spoke about forming friendships/trust, and engaging in meaningfully activities, such as his art, as ways to connect and break down barriers he faces to seeking support.  Modes of Intervention: Activity, Discussion, Education, Socialization and Support  Patient Response to Interventions:  Attentive, Engaged and Receptive   Plan: Continue to engage patient in OT groups 2 - 3x/week.  07/02/2020  Donne Hazel, MOT, OTR/L

## 2020-07-02 NOTE — Tx Team (Signed)
Initial Treatment Plan 07/02/2020 4:48 AM [name] [id number]    PATIENT STRESSORS: Financial difficulties Loss of girlfriend by breakup Medication change or noncompliance   PATIENT STRENGTHS: Average or above average intelligence Capable of independent living Motivation for treatment/growth Supportive family/friends   PATIENT IDENTIFIED PROBLEMS:   depression  anxiety  Suicide attempt by OD  " talk to the doctor"  "open to starting medication"           DISCHARGE CRITERIA:  Ability to meet basic life and health needs Improved stabilization in mood, thinking, and/or behavior Motivation to continue treatment in a less acute level of care Safe-care adequate arrangements made Verbal commitment to aftercare and medication compliance  PRELIMINARY DISCHARGE PLAN: Outpatient therapy Return to previous living arrangement  PATIENT/FAMILY INVOLVEMENT: This treatment plan has been presented to and reviewed with the patient, advaith, lamarque  The patient and family have been given the opportunity to ask questions and make suggestions.  Earnest Conroy, RN 07/02/2020, 4:48 AM
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
## 2020-07-02 NOTE — Progress Notes (Signed)
Patient ID: Riley Smith, male   DOB: [birth year], 19 y.o.   MRN: [id number] D: Patient calm and cooperative with care, currently denies SI/HI/AVH, reports sleep quality last night as being poor, reports appetite as good, describes his energy level as being normal. Pt a/lso reports concentration as being good, rates his mood today as 5 (10 being worst), rates his level of hopelessness as being 4 (10 being worst), rates anxiety as being 4 (10 being worst). Pt offered meds for anxiety, but declined. Pt also complained of head, back and neck pain, offered Tylenol, but declined it. Pt states that the most important thing for him to work on today his talking to the Physician about his depression. This goal was accomplished as pt talked to Physician, Wellbutrin was ordered, pt was educated on this medication, and also given the med. Pt also stated that he plans on working on what brings him joy today (art).   A: Pt being monitored on Q15 minute checks, verbally contracts for safety on the unit.  R: Will continue Q15 minute checks for safety. [place] NOVEL CORONAVIRUS (COVID-19) DAILY CHECK-OFF SYMPTOMS - answer yes or no to each - every day NO YES  Have you had a fever in the past 24 hours?  . Fever (Temp > 37.80C / 100F) X   Have you had any of these symptoms in the past 24 hours? . New Cough .  Sore Throat  .  Shortness of Breath .  Difficulty Breathing .  Unexplained Body Aches   X   Have you had any one of these symptoms in the past 24 hours not related to allergies?   . Runny Nose .  Nasal Congestion .  Sneezing   X   If you have had runny nose, nasal congestion, sneezing in the past 24 hours, has it worsened?  X   EXPOSURES - check yes or no X   Have you traveled outside the state in the past 14 days?  X   Have you been in contact with someone with a confirmed diagnosis of COVID-19 or PUI in the past 14 days without wearing appropriate PPE?  X   Have you been living in the same home  as a person with confirmed diagnosis of COVID-19 or a PUI (household contact)?    X   Have you been diagnosed with COVID-19?    X              What to do next: Answered NO to all: Answered YES to anything:   Proceed with unit schedule Follow the [facility] Inpatient Flowsheet.
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
## 2020-07-02 NOTE — BH Assessment (Addendum)
Disposition: Per Nira Conn, NP recommend inpatient treatment. Per Hassie Bruce, RN pt has been accepted to [facility] Rm/Bed 302-1.  This therapist informed and was acknowledged by Alfonzo Feller, RN of pt's disposition. RN agreed to inform EDP of disposition.  Dolores Frame, MSW, LCSW-A Triage Specialist [phone]
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
## 2020-07-02 NOTE — Tx Team (Signed)
Admission note: :Patient is a  Voluntary admission in no acute distress for suicide attempt by Overdose. Pt repot stressor as  recent break up with girlfriend and losing his job.  Pt reports he is not currently receiving outpatient services, no current medication. Pt is endorsing  suicidal attempt and contrct for self harm. by self harm. Pt admitted to unit per protocol, skin assessment and belonging search done. Pt has multiple old superficial healed cuts on bil forearm. Consent signed by pt. Pt educated on therapeutic milieu rules. Pt was introduced to milieu by nursing staff. Fall risk / suicide safety plan explained to the patient. 15 minutes checks started for safety.

## 2020-07-02 NOTE — [facility] Group Notes (Signed)
The focus of this group is to help patients establish daily goals to achieve during treatment and discuss how the patient can incorporate goal setting into their daily lives to aide in recovery.  Pt did not attend group 

## 2020-07-03 NOTE — Progress Notes (Signed)
Adult Psychoeducational Group Note  Date:  07/03/2020 Time:  6:09 PM  Group Topic/Focus:  Emotional Education:   The focus of this group is to discuss what feelings/emotions are, and how they are experienced.  Participation Level:  Active  Participation Quality:  Appropriate  Affect:  Appropriate  Cognitive:  Alert and Appropriate  Insight: Appropriate, Good and Improving  Engagement in Group:  Engaged  Modes of Intervention:  Discussion  Additional Comments:  Pt attended group and participated in discussion.  Riley Smith [name] 07/03/2020, 6:09 PM

## 2020-07-03 NOTE — Progress Notes (Signed)
Pt reported that he was not having suicidal or homicidal thoughts.  Pt also denied feeling depressed or anxious.  Pt took medications without incident and is pleasant with his peers and the staff on the unit.  RN will continue to monitor and provide support as needed.

## 2020-07-03 NOTE — Tx Team (Signed)
Interdisciplinary Treatment and Diagnostic Plan Update  07/03/2020 Time of Session: 9:10am Riley Smith MRN: [id number]  Principal Diagnosis: Severe recurrent major depression (HCC)  Secondary Diagnoses: Principal Problem:   Severe recurrent major depression (HCC) Active Problems:   Suicide attempt [facility])   Anxiety state   Current Medications:  Current Facility-Administered Medications  Medication Dose Route Frequency Provider Last Rate Last Admin  . acetaminophen (TYLENOL) tablet 650 mg  650 mg Oral Q6H PRN Nira Conn A, NP      . alum & mag hydroxide-simeth (MAALOX/MYLANTA) 200-200-20 MG/5ML suspension 30 mL  30 mL Oral Q4H PRN Nira Conn A, NP      . buPROPion (WELLBUTRIN XL) 24 hr tablet 150 mg  150 mg Oral Daily Dagar, Anjali, MD   150 mg at 07/03/20 0804  . famotidine (PEPCID) tablet 20 mg  20 mg Oral BID Dagar, Geralynn Rile, MD   20 mg at 07/03/20 0804  . hydrOXYzine (ATARAX/VISTARIL) tablet 25 mg  25 mg Oral TID PRN Jackelyn Poling, NP   25 mg at 07/02/20 2115  . magnesium hydroxide (MILK OF MAGNESIA) suspension 30 mL  30 mL Oral Daily PRN Nira Conn A, NP      . ondansetron (ZOFRAN) tablet 4 mg  4 mg Oral Q8H PRN Dagar, Geralynn Rile, MD      . traZODone (DESYREL) tablet 50 mg  50 mg Oral QHS PRN Jackelyn Poling, NP   50 mg at 07/02/20 2115   PTA Medications: Medications Prior to Admission  Medication Sig Dispense Refill Last Dose  . dicyclomine (BENTYL) 20 MG tablet Take 1 tablet (20 mg total) by mouth 4 (four) times daily -  before meals and at bedtime. (Patient not taking: Reported on 07/01/2020) 30 tablet 0   . ondansetron (ZOFRAN-ODT) 4 MG disintegrating tablet Take 1 tablet (4 mg total) by mouth every 8 (eight) hours as needed for nausea or vomiting. (Patient not taking: Reported on 07/01/2020) 24 tablet 0     Patient Stressors: Financial difficulties Loss of girlfriend by breakup Medication change or noncompliance  Patient Strengths: Average or above average  intelligence Capable of independent living Motivation for treatment/growth Supportive family/friends  Treatment Modalities: Medication Management, Group therapy, Case management,  1 to 1 session with clinician, Psychoeducation, Recreational therapy.   Physician Treatment Plan for Primary Diagnosis: Severe recurrent major depression (HCC) Long Term Goal(s): Improvement in symptoms so as ready for discharge Improvement in symptoms so as ready for discharge   Short Term Goals: Ability to verbalize feelings will improve Ability to disclose and discuss suicidal ideas Compliance with prescribed medications will improve Ability to identify changes in lifestyle to reduce recurrence of condition will improve Ability to identify and develop effective coping behaviors will improve  Medication Management: Evaluate patient's response, side effects, and tolerance of medication regimen.  Therapeutic Interventions: 1 to 1 sessions, Unit Group sessions and Medication administration.  Evaluation of Outcomes: Progressing  Physician Treatment Plan for Secondary Diagnosis: Principal Problem:   Severe recurrent major depression (HCC) Active Problems:   Suicide attempt (HCC)   Anxiety state  Long Term Goal(s): Improvement in symptoms so as ready for discharge Improvement in symptoms so as ready for discharge   Short Term Goals: Ability to verbalize feelings will improve Ability to disclose and discuss suicidal ideas Compliance with prescribed medications will improve Ability to identify changes in lifestyle to reduce recurrence of condition will improve Ability to identify and develop effective coping behaviors will improve  Medication Management: Evaluate patient's response, side effects, and tolerance of medication regimen.  Therapeutic Interventions: 1 to 1 sessions, Unit Group sessions and Medication administration.  Evaluation of Outcomes: Progressing   RN Treatment Plan for Primary  Diagnosis: Severe recurrent major depression (HCC) Long Term Goal(s): Knowledge of disease and therapeutic regimen to maintain health will improve  Short Term Goals: Ability to remain free from injury will improve, Ability to verbalize frustration and anger appropriately will improve, Ability to identify and develop effective coping behaviors will improve and Compliance with prescribed medications will improve  Medication Management: RN will administer medications as ordered by provider, will assess and evaluate patient's response and provide education to patient for prescribed medication. RN will report any adverse and/or side effects to prescribing provider.  Therapeutic Interventions: 1 on 1 counseling sessions, Psychoeducation, Medication administration, Evaluate responses to treatment, Monitor vital signs and CBGs as ordered, Perform/monitor CIWA, COWS, AIMS and Fall Risk screenings as ordered, Perform wound care treatments as ordered.  Evaluation of Outcomes: Progressing   LCSW Treatment Plan for Primary Diagnosis: Severe recurrent major depression (HCC) Long Term Goal(s): Safe transition to appropriate next level of care at discharge, Engage patient in therapeutic group addressing interpersonal concerns.  Short Term Goals: Engage patient in aftercare planning with referrals and resources, Increase social support, Identify triggers associated with mental health/substance abuse issues and Increase skills for wellness and recovery  Therapeutic Interventions: Assess for all discharge needs, 1 to 1 time with Social worker, Explore available resources and support systems, Assess for adequacy in community support network, Educate family and significant other(s) on suicide prevention, Complete Psychosocial Assessment, Interpersonal group therapy.  Evaluation of Outcomes: Progressing   Progress in Treatment: Attending groups: Yes. Participating in groups: Yes. Taking medication as prescribed:  Yes. Toleration medication: Yes. Family/Significant other contact made: Yes, individual(s) contacted:  mother Patient understands diagnosis: Yes. Discussing patient identified problems/goals with staff: Yes. Medical problems stabilized or resolved: Yes. Denies suicidal/homicidal ideation: Yes. Issues/concerns per patient self-inventory: No.   New problem(s) identified: No, Describe:  none  New Short Term/Long Term Goal(s): medication stabilization, elimination of SI thoughts, development of comprehensive mental wellness plan.   Patient Goals:  "Working on my depression and anxiety"  Discharge Plan or Barriers: Patient is to return home to live with his mother and is to follow up with outpatient provider for continuation of care.  Reason for Continuation of Hospitalization: Anxiety Depression Medication stabilization Suicidal ideation  Estimated Length of Stay: 1-3 days   Attendees: Patient: Riley Smith 07/03/2020  Physician:  07/03/2020   Nursing:  07/03/2020   RN Care Manager: 07/03/2020   Social Worker: Ruthann Cancer, LCSW 07/03/2020   Recreational Therapist:  07/03/2020  Other: Marciano Sequin, NP 07/03/2020   Other:  07/03/2020  Other: 07/03/2020      Scribe for Treatment Team: Otelia Santee, LCSW 07/03/2020 10:02 AM

## 2020-07-03 NOTE — [facility] Group Notes (Signed)
[facility] LCSW Group Therapy  07/03/2020 2:31 PM Type of Therapy:  Self-Care  Participation Level:  Attended  Summary of Progress/Problems: Pt was encouraged to attend group on discussion of self-care and to learn about the therapeutic benefits of self-care. Pt shared that he partakes in good hygiene in order to do self-care.   Felizardo Hoffmann 07/03/2020, 2:31 PM

## 2020-07-03 NOTE — Progress Notes (Signed)
[facility] MD Progress Note  07/03/2020 11:10 AM Riley Smith  MRN:  [id number]   Subjective:  Patient states he is feeling good today. He denies any suicidal or homicidal ideations today. He denies any auditory or visual hallucinations. He states he has good appetite and finished his breakfast. He states he slept great yesterday, did not wake up even one time and slept whole night. He puts his mood on 7/10 ( 10 being most happy) and anxiety on 0/10.   Objective: Patient was sitting on his bed, having conversation with his roommate and followed the provider to the office. He was polite & respectful on approach, alert, oriented* 3. He does not seem like responding to the internal stimuli. He does not show any self injurious behavior on the unit. Blood pressure (!) 96/58, pulse 92, temperature (!) 97.5 F (36.4 C), temperature source Oral, resp. rate 16, height 6' 0.99" (1.854 m), weight 105.2 kg, SpO2 98 %.Body mass index is 30.62 kg/m. He slept 6.75 hours last night.  Phone conversation with mother: Mother was concerned about any permanent damage to patient's liver & kidneys from overdose on Ibuprofen. Patient consented to share his plan and care and lab results with the mother. All the information was provided to her and she was appreciative of all call from provider and felt relieved about patient's ongoing situation. She wanted to meet patient in person but she was informed about restrictions due to pandemic and she showed good understanding.    Principal Problem: Severe recurrent major depression (HCC) Diagnosis: Principal Problem:   Severe recurrent major depression (HCC) Active Problems:   Suicide attempt (HCC)   Anxiety state  Total Time spent with patient: 25 minutes  Past Psychiatric History: See H & P  Past Medical History: History reviewed. No pertinent past medical history. History reviewed. No pertinent surgical history. Family History:  Family History  Problem Relation Age of  Onset  . Healthy Mother   . Healthy Father    Family Psychiatric  History: See H & P Social History:  Social History   Substance and Sexual Activity  Alcohol Use Never     Social History   Substance and Sexual Activity  Drug Use Never    Social History   Socioeconomic History  . Marital status: Single    Spouse name: Not on file  . Number of children: Not on file  . Years of education: Not on file  . Highest education level: Not on file  Occupational History  . Not on file  Tobacco Use  . Smoking status: Never Smoker  . Smokeless tobacco: Never Used  Substance and Sexual Activity  . Alcohol use: Never  . Drug use: Never  . Sexual activity: Not on file  Other Topics Concern  . Not on file  Social History Narrative  . Not on file   Social Determinants of Health   Financial Resource Strain:   . Difficulty of Paying Living Expenses: Not on file  Food Insecurity:   . Worried About Programme researcher, broadcasting/film/video in the Last Year: Not on file  . Ran Out of Food in the Last Year: Not on file  Transportation Needs:   . Lack of Transportation (Medical): Not on file  . Lack of Transportation (Non-Medical): Not on file  Physical Activity:   . Days of Exercise per Week: Not on file  . Minutes of Exercise per Session: Not on file  Stress:   . Feeling of Stress : Not  on file  Social Connections:   . Frequency of Communication with Friends and Family: Not on file  . Frequency of Social Gatherings with Friends and Family: Not on file  . Attends Religious Services: Not on file  . Active Member of Clubs or Organizations: Not on file  . Attends BankerClub or Organization Meetings: Not on file  . Marital Status: Not on file   Additional Social History:                         Sleep: Good  Appetite:  Good  Current Medications: Current Facility-Administered Medications  Medication Dose Route Frequency Provider Last Rate Last Admin  . acetaminophen (TYLENOL) tablet 650 mg  650  mg Oral Q6H PRN Nira ConnBerry, Jason A, NP      . alum & mag hydroxide-simeth (MAALOX/MYLANTA) 200-200-20 MG/5ML suspension 30 mL  30 mL Oral Q4H PRN Nira ConnBerry, Jason A, NP      . buPROPion (WELLBUTRIN XL) 24 hr tablet 150 mg  150 mg Oral Daily Amethyst Gainer, MD   150 mg at 07/03/20 0804  . famotidine (PEPCID) tablet 20 mg  20 mg Oral BID Everrett Lacasse, Geralynn RileAnjali, MD   20 mg at 07/03/20 0804  . hydrOXYzine (ATARAX/VISTARIL) tablet 25 mg  25 mg Oral TID PRN Jackelyn PolingBerry, Jason A, NP   25 mg at 07/02/20 2115  . magnesium hydroxide (MILK OF MAGNESIA) suspension 30 mL  30 mL Oral Daily PRN Nira ConnBerry, Jason A, NP      . ondansetron (ZOFRAN) tablet 4 mg  4 mg Oral Q8H PRN Jaeceon Michelin, Geralynn RileAnjali, MD      . traZODone (DESYREL) tablet 50 mg  50 mg Oral QHS PRN Jackelyn PolingBerry, Jason A, NP   50 mg at 07/02/20 2115    Lab Results:  Results for orders placed or performed during the hospital encounter of 07/02/20 (from the past 48 hour(s))  Lipid panel     Status: Abnormal   Collection Time: 07/02/20  6:12 AM  Result Value Ref Range   Cholesterol 184 0 - 200 mg/dL   Triglycerides 161207 (H) <150 mg/dL   HDL 44 >09>40 mg/dL   Total CHOL/HDL Ratio 4.2 RATIO   VLDL 41 (H) 0 - 40 mg/dL   LDL Cholesterol 99 0 - 99 mg/dL    Comment:        Total Cholesterol/HDL:CHD Risk Coronary Heart Disease Risk Table                     Men   Women  1/2 Average Risk   3.4   3.3  Average Risk       5.0   4.4  2 X Average Risk   9.6   7.1  3 X Average Risk  23.4   11.0        Use the calculated Patient Ratio above and the CHD Risk Table to determine the patient's CHD Risk.        ATP III CLASSIFICATION (LDL):  <100     mg/dL   Optimal  [id number]  mg/dL   Near or Above                    Optimal  130-159  mg/dL   Borderline  [id number]  mg/dL   High  >478>190     mg/dL   Very High Performed at [facility], [address] [place], [place] [id number]   TSH  Status: Abnormal   Collection Time: 07/02/20  6:12 AM  Result Value Ref Range   TSH 4.570 (H)  0.350 - 4.500 uIU/mL    Comment: Performed by a 3rd Generation assay with a functional sensitivity of <=0.01 uIU/mL. Performed at [facility], [address] [place], [place] [address]   T4, free     Status: None   Collection Time: 07/02/20  5:58 PM  Result Value Ref Range   Free T4 0.65 0.61 - 1.12 ng/dL    Comment: (NOTE) Biotin ingestion may interfere with free T4 tests. If the results are inconsistent with the TSH level, previous test results, or the clinical presentation, then consider biotin interference. If needed, order repeat testing after stopping biotin. Performed at [facility], [address] [place], [place] [address]     Blood Alcohol level:  Lab Results  Component Value Date   ETH <10 07/01/2020    Metabolic Disorder Labs: No results found for: HGBA1C, MPG No results found for: PROLACTIN Lab Results  Component Value Date   CHOL 184 07/02/2020   TRIG 207 (H) 07/02/2020   HDL 44 07/02/2020   CHOLHDL 4.2 07/02/2020   VLDL 41 (H) 07/02/2020   LDLCALC 99 07/02/2020    Physical Findings: AIMS: Facial and Oral Movements Muscles of Facial Expression: None, normal Lips and Perioral Area: None, normal Jaw: None, normal Tongue: None, normal,Extremity Movements Upper (arms, wrists, hands, fingers): None, normal Lower (legs, knees, ankles, toes): None, normal, Trunk Movements Neck, shoulders, hips: None, normal, Overall Severity Severity of abnormal movements (highest score from questions above): None, normal Incapacitation due to abnormal movements: None, normal Patient's awareness of abnormal movements (rate only patient's report): No Awareness, Dental Status Current problems with teeth and/or dentures?: No Does patient usually wear dentures?: No  CIWA:    COWS:     Musculoskeletal: Strength & Muscle Tone: within normal limits Gait & Station: normal Patient leans: N/A  Psychiatric Specialty Exam: Physical Exam Vitals and  nursing note reviewed.  HENT:     Head: Normocephalic and atraumatic.     Nose: Nose normal.  Eyes:     Pupils: Pupils are equal, round, and reactive to light.  Pulmonary:     Effort: Pulmonary effort is normal.  Musculoskeletal:     Cervical back: Normal range of motion.  Neurological:     General: No focal deficit present.     Mental Status: He is alert and oriented to person, place, and time.  Psychiatric:        Attention and Perception: Attention and perception normal.        Speech: Speech normal.        Behavior: Behavior is cooperative.        Thought Content: Thought content normal.        Cognition and Memory: Cognition and memory normal.     Review of Systems  Constitutional: Negative.   HENT: Negative.   Eyes: Negative.   Respiratory: Negative.   Gastrointestinal: Negative.   Neurological: Negative.   Psychiatric/Behavioral: Positive for dysphoric mood.    Blood pressure (!) 96/58, pulse 92, temperature (!) 97.5 F (36.4 C), temperature source Oral, resp. rate 16, height 6' 0.99" (1.854 m), weight 105.2 kg, SpO2 98 %.Body mass index is 30.62 kg/m.  General Appearance: Casual  Eye Contact:  Good  Speech:  Normal Rate  Volume:  Normal  Mood:  Euthymic  Affect:  Appropriate  Thought Process:  Linear and Descriptions  of Associations: Intact  Orientation:  Full (Time, Place, and Person)  Thought Content:  Logical  Suicidal Thoughts:  No  Homicidal Thoughts:  No  Memory:  Immediate;   Good Recent;   Fair Remote;   Fair  Judgement:  Fair  Insight:  Fair  Psychomotor Activity:  Normal  Concentration:  Concentration: Good and Attention Span: Good  Recall:  Fair  Fund of Knowledge:  Good  Language:  Good  Akathisia:  Negative  Handed:  Right  AIMS (if indicated):     Assets:  Communication Skills Desire for Improvement Housing Resilience Social Support Transportation  ADL's:  Intact  Cognition:  WNL  Sleep:  6.75 hours   Assessment: Riley Smith  is a 19 yo M with no past psychiatric history who presented voluntarily via EMS to [facility] on 07/01/2020 after a suicide attempt by overdosing on 8 Ibuprofen tablets ( 200 mg)and 8 Sleep-aids Benadryl tablets. He was admitted to [facility] voluntarily on 07/02/2020 for further assessment and stabilization.  Diagnosis:  1. Major depressive disorder 2. GERD  Pertinent findings today:   1. Improved mood and affect 2. No suicidal ideations 3. Active in milieu  Treatment Plan Summary: Daily contact with patient to assess and evaluate symptoms and progress in treatment.  Scheduled medications:  1. Wellbutrin 24 hr tablet 150 mg for depressed mood and low energy 2. Famotidine 20 mg BID for acid reflux and nausea.   PRN's :  1. Tylenol 650 mg for mild pain 2. Maalox 30 ml for indigestion 3. Vistaril 25 mg 4. Zofran 4 mg for nausea 5. Milk of magnesia 30 ml for mild constipation.  6. Trazodone 50 mg for insomnia   Psychosocial:  1. Encouragement to attend group therapies. 2. Encouragement for medication compliance 3. Disposition in progress.  Arnoldo Lenis, MD 07/03/2020, 11:10 AM

## 2020-07-03 NOTE — Progress Notes (Signed)
Patient was cooperative with treatment. He spent most of the evening in the dayroom talking with peers. He was pleasant on approach but he remains anxious, hyperverbal and depressed. He denies SI & AVH. He was medication complaint and he is currently resting in bed quietly.  Will continue to monitor behavior.

## 2020-07-03 NOTE — Progress Notes (Signed)
Adult Psychoeducational Group Note  Date:  07/03/2020 Time:  10:45 PM  Group Topic/Focus:  Wrap-Up Group:   The focus of this group is to help patients review their daily goal of treatment and discuss progress on daily workbooks.  Participation Level:  Active  Participation Quality:  Appropriate  Affect:  Appropriate  Cognitive:  Appropriate  Insight: Appropriate  Engagement in Group:  Engaged  Modes of Intervention:  Discussion  Additional Comments:  Pt attend wrap up group. His day was 7. The one positive thing that happen to him the group during the day was helpful  to him and he talked to family and friend thru out his day.  Charna Busman Long 07/03/2020, 10:45 PM

## 2020-07-04 LAB — T3, FREE: T3, Free: 3.1 pg/mL (ref 2.0–4.4)

## 2020-07-04 MED ORDER — TRAZODONE HCL 50 MG PO TABS: 50.0000 mg | ORAL_TABLET | Freq: Every evening | ORAL | 0 refills | Status: DC | PRN

## 2020-07-04 MED ORDER — HYDROXYZINE HCL 25 MG PO TABS: 25.0000 mg | ORAL_TABLET | Freq: Three times a day (TID) | ORAL | 0 refills | Status: DC | PRN

## 2020-07-04 MED ORDER — BUPROPION HCL ER (XL) 150 MG PO TB24: 150.0000 mg | ORAL_TABLET | Freq: Every day | ORAL | 0 refills | Status: DC

## 2020-07-04 NOTE — Progress Notes (Signed)
   07/04/20 0300  Psych Admission Type (Psych Patients Only)  Admission Status Voluntary  Psychosocial Assessment  Patient Complaints Anhedonia;Anxiety  Eye Contact Fair  Facial Expression Anxious  Affect Depressed  Speech Logical/coherent  Interaction Defensive;Guarded  Motor Activity Fidgety  Appearance/Hygiene Unremarkable  Behavior Characteristics Cooperative  Mood Anxious  Thought Process  Coherency WDL  Content WDL  Delusions WDL  Perception WDL  Hallucination None reported or observed  Judgment Impaired  Confusion None  Danger to Self  Current suicidal ideation? Denies  Self-Injurious Behavior No self-injurious ideation or behavior indicators observed or expressed   Agreement Not to Harm Self Yes  Description of Agreement verbally contracts for safety  Danger to Others  Danger to Others None reported or observed

## 2020-07-04 NOTE — BHH Group Notes (Signed)
LCSW Group Therapy Note  07/04/2020   10:00-11:00am   Type of Therapy and Topic:  Group Therapy: Anger Cues and Responses  Participation Level:  Active   Description of Group:   In this group, patients learned how to recognize the physical, cognitive, emotional, and behavioral responses they have to anger-provoking situations.  They identified a recent time they became angry and how they reacted.  They analyzed how their reaction was possibly beneficial and how it was possibly unhelpful.  The group discussed a variety of healthier coping skills that could help with such a situation in the future.  Focus was placed on how helpful it is to recognize the underlying emotions to our anger, because working on those can lead to a more permanent solution as well as our ability to focus on the important rather than the urgent.  Therapeutic Goals: 1. Patients will remember their last incident of anger and how they felt emotionally and physically, what their thoughts were at the time, and how they behaved. 2. Patients will identify how their behavior at that time worked for them, as well as how it worked against them. 3. Patients will explore possible new behaviors to use in future anger situations. 4. Patients will learn that anger itself is normal and cannot be eliminated, and that healthier reactions can assist with resolving conflict rather than worsening situations.  Summary of Patient Progress:  The patient shared that his most recent time of anger was Wednesday, the day he was admitted to the hospital.  He said he was angry at himself for having attempted suicide the day before because he had not been thinking of others and this made him feel guilty.  His anger was rated 6 out of 10.  His chosen coping method was to call for help and that is how he ended up in the hospital.  The patient was very engaged throughout the group and interacted well, with insight.  Therapeutic Modalities:   Cognitive  Behavioral Therapy  Lynnell Chad

## 2020-07-04 NOTE — Progress Notes (Signed)
Discharge Note:  Patient discharged with family member to his home.  Patient denied SI and HI.  Denied A/V hallucinations.  Suicide prevention information given and discussed with patient who stated he understood and had no questions.  Patient stated he received all his belongings, clothing, toiletries, misc items, etc.  Patient stated he appreciated all assistance received from [facility] staff.  All required discharge information given at discharge.

## 2020-07-04 NOTE — [facility] Suicide Risk Assessment (Signed)
[facility] Discharge Suicide Risk Assessment   Principal Problem: Severe recurrent major depression (HCC) Discharge Diagnoses: Principal Problem:   Severe recurrent major depression (HCC) Active Problems:   Suicide attempt (HCC)   Anxiety state   Total Time spent with patient: 15 minutes  Musculoskeletal: Strength & Muscle Tone: within normal limits Gait & Station: normal Patient leans: N/A  Psychiatric Specialty Exam: Review of Systems  All other systems reviewed and are negative.   Blood pressure 115/68, pulse 79, temperature (!) 96.7 F (35.9 C), resp. rate 16, height 6' 0.99" (1.854 m), weight 105.2 kg, SpO2 99 %.Body mass index is 30.62 kg/m.  General Appearance: Casual  Eye Contact::  Good  Speech:  Normal Rate409  Volume:  Normal  Mood:  Euthymic  Affect:  Congruent  Thought Process:  Coherent and Descriptions of Associations: Intact  Orientation:  Full (Time, Place, and Person)  Thought Content:  Logical  Suicidal Thoughts:  No  Homicidal Thoughts:  No  Memory:  Immediate;   Good Recent;   Good Remote;   Good  Judgement:  Intact  Insight:  Fair  Psychomotor Activity:  Normal  Concentration:  Fair  Recall:  Good  Fund of Knowledge:Good  Language: Good  Akathisia:  Negative  Handed:  Right  AIMS (if indicated):     Assets:  Desire for Improvement Housing Resilience  Sleep:  Number of Hours: 5.45  Cognition: WNL  ADL's:  Intact   Mental Status Per Nursing Assessment::   On Admission:  NA  Demographic Factors:  Male and Caucasian  Loss Factors: NA  Historical Factors: Impulsivity  Risk Reduction Factors:   Positive social support  Continued Clinical Symptoms:  Depression:   Impulsivity  Cognitive Features That Contribute To Risk:  None    Suicide Risk:  Minimal: No identifiable suicidal ideation.  Patients presenting with no risk factors but with morbid ruminations; may be classified as minimal risk based on the severity of the depressive  symptoms   Follow-up Information    [facility]. Go on 07/10/2020.   Specialty: Urgent Care Why: You have a walk in appointment on 07/10/20 at 12:30 pm for therapy.  You also have an appointment for medication management on 07/30/20 at 1:30 pm.  These appointments will be held in person. Contact information: [address] [place] [address] [phone]              Plan Of Care/Follow-up recommendations:  Activity:  ad lib  Antonieta Pert, MD 07/04/2020, 8:51 AM

## 2020-07-04 NOTE — Discharge Summary (Signed)
Physician Discharge Summary Note  Patient:  Riley Smith is an 19 y.o., male MRN:  [id number] DOB:  2000-10-21 Patient phone:  [phone] (home)  Patient address:   [address] [place] [address],  Total Time spent with patient: 15 minutes  Date of Admission:  07/02/2020 Date of Discharge: 07/04/2020  Reason for Admission:  Per admission assessment note: Riley Smith is a 19 yo M with no past psychiatric history who presented voluntarily via EMS to [facility] on 07/01/2020 after a suicide attempt by overdosing on 8 Ibuprofen tablets ( 200 mg)and 8 Sleep-aids Benadryl tablets. He was admitted to [facility] voluntarily on 07/02/2020 for further assessment and stabilization.  Principal Problem: Severe recurrent major depression [name]) Discharge Diagnoses: Principal Problem:   Severe recurrent major depression (HCC) Active Problems:   Suicide attempt [facility])   Anxiety state   Past Psychiatric History:   Past Medical History: History reviewed. No pertinent past medical history. History reviewed. No pertinent surgical history. Family History:  Family History  Problem Relation Age of Onset  . Healthy Mother   . Healthy Father    Family Psychiatric  History:  Social History:  Social History   Substance and Sexual Activity  Alcohol Use Never     Social History   Substance and Sexual Activity  Drug Use Never    Social History   Socioeconomic History  . Marital status: Single    Spouse name: Not on file  . Number of children: Not on file  . Years of education: Not on file  . Highest education level: Not on file  Occupational History  . Not on file  Tobacco Use  . Smoking status: Never Smoker  . Smokeless tobacco: Never Used  Substance and Sexual Activity  . Alcohol use: Never  . Drug use: Never  . Sexual activity: Not on file  Other Topics Concern  . Not on file  Social History Narrative  . Not on file   Social Determinants of Health   Financial  Resource Strain:   . Difficulty of Paying Living Expenses: Not on file  Food Insecurity:   . Worried About Programme researcher, broadcasting/film/video in the Last Year: Not on file  . Ran Out of Food in the Last Year: Not on file  Transportation Needs:   . Lack of Transportation (Medical): Not on file  . Lack of Transportation (Non-Medical): Not on file  Physical Activity:   . Days of Exercise per Week: Not on file  . Minutes of Exercise per Session: Not on file  Stress:   . Feeling of Stress : Not on file  Social Connections:   . Frequency of Communication with Friends and Family: Not on file  . Frequency of Social Gatherings with Friends and Family: Not on file  . Attends Religious Services: Not on file  . Active Member of Clubs or Organizations: Not on file  . Attends Banker Meetings: Not on file  . Marital Status: Not on file    Hospital Course:  Riley Smith was admitted for Severe recurrent major depression (HCC)  and crisis management.  Pt was treated discharged with the medications listed below under Medication List.  Medical problems were identified and treated as needed.  Home medications were restarted as appropriate.  Improvement was monitored by observation and Riley Smith 's daily report of symptom reduction.  Emotional and mental status was monitored by daily self-inventory reports completed by Riley Smith and  clinical staff.         Riley Smith was evaluated by the treatment team for stability and plans for continued recovery upon discharge. Riley Smith 's motivation was an integral factor for scheduling further treatment. Employment, transportation, bed availability, health status, family support, and any pending legal issues were also considered during hospital stay. Pt was offered further treatment options upon discharge including but not limited to Residential, Intensive Outpatient, and Outpatient treatment.  Riley Smith will follow up with the services as listed  below under Follow Up Information.     Upon completion of this admission the patient was both mentally and medically stable for discharge denying suicidal/homicidal ideation or  auditory/visual    Riley Smith responded well to treatment with Trazodone 50 mg and Wellbutrin 150 mg without adverse effects. Pt demonstrated improvement without reported or observed adverse effects to the point of stability appropriate for outpatient management. Pertinent labs include: Lipid Panel elevated,TSH 4.570, for which outpatient follow-up is necessary for lab recheck as mentioned below. Reviewed CBC, CMP, BAL, and UDS-; all unremarkable aside from noted exceptions.   Physical Findings: AIMS: Facial and Oral Movements Muscles of Facial Expression: None, normal Lips and Perioral Area: None, normal Jaw: None, normal Tongue: None, normal,Extremity Movements Upper (arms, wrists, hands, fingers): None, normal Lower (legs, knees, ankles, toes): None, normal, Trunk Movements Neck, shoulders, hips: None, normal, Overall Severity Severity of abnormal movements (highest score from questions above): None, normal Incapacitation due to abnormal movements: None, normal Patient's awareness of abnormal movements (rate only patient's report): No Awareness, Dental Status Current problems with teeth and/or dentures?: No Does patient usually wear dentures?: No  CIWA:    COWS:     Musculoskeletal: Strength & Muscle Tone: within normal limits Gait & Station: normal Patient leans: N/A  Psychiatric Specialty Exam: See SRA by MD  Physical Exam  Review of Systems  Blood pressure 115/68, pulse 79, temperature (!) 96.7 F (35.9 C), resp. rate 16, height 6' 0.99" (1.854 m), weight 105.2 kg, SpO2 99 %.Body mass index is 30.62 kg/m.   Have you used any form of tobacco in the last 30 days? (Cigarettes, Smokeless Tobacco, Cigars, and/or Pipes): No  Has this patient used any form of tobacco in the last 30 days? (Cigarettes,  Smokeless Tobacco, Cigars, and/or Pipe Yes, A prescription for an FDA-approved tobacco cessation medication was offered at discharge and the patient refused  Blood Alcohol level:  Lab Results  Component Value Date   ETH <10 07/01/2020    Metabolic Disorder Labs:  No results found for: HGBA1C, MPG No results found for: PROLACTIN Lab Results  Component Value Date   CHOL 184 07/02/2020   TRIG 207 (H) 07/02/2020   HDL 44 07/02/2020   CHOLHDL 4.2 07/02/2020   VLDL 41 (H) 07/02/2020   LDLCALC 99 07/02/2020    See Psychiatric Specialty Exam and Suicide Risk Assessment completed by Attending Physician prior to discharge.  Discharge destination:  Home  Is patient on multiple antipsychotic therapies at discharge:  No   Has Patient had three or more failed trials of antipsychotic monotherapy by history:  No  Recommended Plan for Multiple Antipsychotic Therapies: NA  Discharge Instructions    Diet - low sodium heart healthy   Complete by: As directed    Discharge instructions   Complete by: As directed    Take all medications as prescribed. Keep all follow-up appointments as scheduled.  Do not consume alcohol or use illegal drugs  while on prescription medications. Report any adverse effects from your medications to your primary care provider promptly.  In the event of recurrent symptoms or worsening symptoms, call 911, a crisis hotline, or go to the nearest emergency department for evaluation.   Increase activity slowly   Complete by: As directed      Allergies as of 07/04/2020   No Known Allergies     Medication List    STOP taking these medications   dicyclomine 20 MG tablet Commonly known as: BENTYL   ondansetron 4 MG disintegrating tablet Commonly known as: ZOFRAN-ODT     TAKE these medications     Indication  buPROPion 150 MG 24 hr tablet Commonly known as: WELLBUTRIN XL Take 1 tablet (150 mg total) by mouth daily. Start taking on: July 05, 2020   Indication: Major Depressive Disorder   hydrOXYzine 25 MG tablet Commonly known as: ATARAX/VISTARIL Take 1 tablet (25 mg total) by mouth 3 (three) times daily as needed for anxiety.  Indication: Feeling Anxious   traZODone 50 MG tablet Commonly known as: DESYREL Take 1 tablet (50 mg total) by mouth at bedtime as needed for sleep.  Indication: Anxiety Disorder       Follow-up Information    [facility]. Go on 07/10/2020.   Specialty: Urgent Care Why: You have a walk in appointment on 07/10/20 at 12:30 pm for therapy.  You also have an appointment for medication management on 07/30/20 at 1:30 pm.  These appointments will be held in person. Contact information: [address] [place] [address] [phone]              Follow-up recommendations:  Activity:  as tolerated Diet:  heart healthy  Comments: Take all medications as prescribed. Keep all follow-up appointments as scheduled.  Do not consume alcohol or use illegal drugs while on prescription medications. Report any adverse effects from your medications to your primary care provider promptly.  In the event of recurrent symptoms or worsening symptoms, call 911, a crisis hotline, or go to the nearest emergency department for evaluation.   Signed:  Oneta Rack, NP 07/04/2020, 9:35 AM

## 2020-07-04 NOTE — Progress Notes (Signed)
D:  Patient's self inventory sheet, patient sleeps good, no sleep medication.  Good appetite, normal energy level, good concentration.  Denied depression, hopeless and anxiety.  Denied withdrawals.  Denied SI.  Denied physical problems.  Denied physical pain.  Does have discharge plans. A:  Medications administered per MD orders.  Emotional support and encouragement given patient. R:  Denied SI and HI, contracts for safety.  Denied HI.  Denied A/V hallucinations.  Safety maintained with 15 minute checks.

## 2020-07-04 NOTE — Plan of Care (Signed)
Nurse discussed anxiety, depression and coping skills. 

## 2020-07-04 NOTE — Progress Notes (Signed)
  [facility] Adult Case Management Discharge Plan :  Will you be returning to the same living situation after discharge:  Yes,  with parent At discharge, do you have transportation home?: Yes,  either parent or bus Do you have the ability to pay for your medications: No.  Release of information consent forms completed and emailed to Medical Records, then turned in to Medical Records by CSW.   Patient to Follow up at:  Follow-up Information    [facility]. Go on 07/10/2020.   Specialty: Urgent Care Why: You have a walk in appointment on 07/10/20 at 12:30 pm for therapy.  You also have an appointment for medication management on 07/30/20 at 1:30 pm.  These appointments will be held in person. Contact information: [address] [place] [address] [phone]              Next level of care provider has access to [facility] Link:yes  Safety Planning and Suicide Prevention discussed: Yes,  with parent  Have you used any form of tobacco in the last 30 days? (Cigarettes, Smokeless Tobacco, Cigars, and/or Pipes): No  Has patient been referred to the Quitline?: N/A patient is not a smoker  Patient has been referred for addiction treatment: N/A  Lynnell Chad, LCSW 07/04/2020, 9:24 AM

## 2020-07-06 LAB — HEMOGLOBIN A1C
Hgb A1c MFr Bld: 5.2 % (ref 4.8–5.6)
Mean Plasma Glucose: 103 mg/dL

## 2020-07-14 ENCOUNTER — Ambulatory Visit (INDEPENDENT_AMBULATORY_CARE_PROVIDER_SITE_OTHER): Payer: No Payment, Other | Admitting: Physician Assistant

## 2020-07-14 ENCOUNTER — Encounter (HOSPITAL_COMMUNITY): Payer: Self-pay | Admitting: Physician Assistant

## 2020-07-14 ENCOUNTER — Other Ambulatory Visit: Payer: Self-pay

## 2020-07-14 VITALS — BP 102/68 | HR 64 | Ht 73.0 in | Wt 236.0 lb

## 2020-07-14 DIAGNOSIS — G4700 Insomnia, unspecified: Secondary | ICD-10-CM | POA: Diagnosis not present

## 2020-07-14 DIAGNOSIS — F411 Generalized anxiety disorder: Secondary | ICD-10-CM

## 2020-07-14 DIAGNOSIS — F332 Major depressive disorder, recurrent severe without psychotic features: Secondary | ICD-10-CM | POA: Diagnosis not present

## 2020-07-14 MED ORDER — BUPROPION HCL ER (XL) 150 MG PO TB24: 150.0000 mg | ORAL_TABLET | Freq: Every day | ORAL | 2 refills | Status: DC

## 2020-07-14 MED ORDER — HYDROXYZINE HCL 25 MG PO TABS: 25.0000 mg | ORAL_TABLET | Freq: Three times a day (TID) | ORAL | 1 refills | Status: DC | PRN

## 2020-07-14 MED ORDER — TRAZODONE HCL 50 MG PO TABS: 50.0000 mg | ORAL_TABLET | Freq: Every evening | ORAL | 2 refills | Status: DC | PRN

## 2020-07-17 ENCOUNTER — Other Ambulatory Visit (HOSPITAL_COMMUNITY): Payer: Self-pay | Admitting: Physician Assistant

## 2020-07-17 MED ORDER — HYDROXYZINE HCL 25 MG PO TABS: 25.0000 mg | ORAL_TABLET | Freq: Three times a day (TID) | ORAL | 1 refills | Status: DC | PRN

## 2020-07-17 MED ORDER — TRAZODONE HCL 50 MG PO TABS: 50.0000 mg | ORAL_TABLET | Freq: Every evening | ORAL | 2 refills | Status: DC | PRN

## 2020-07-17 MED ORDER — BUPROPION HCL ER (XL) 150 MG PO TB24: 150.0000 mg | ORAL_TABLET | Freq: Every day | ORAL | 2 refills | Status: DC

## 2020-07-17 MED FILL — BUPROPION HCL XL 150 MG TAB: 150 | 30 days supply | Qty: 30 | Fill #0

## 2020-07-17 MED FILL — hydrOXYzine HCL 25 MG TABS: 25 | 30 days supply | Qty: 90 | Fill #0

## 2020-07-17 MED FILL — traZODone HCL 50 MG TABS: 50 | 30 days supply | Qty: 30 | Fill #0

## 2020-07-30 ENCOUNTER — Encounter (HOSPITAL_COMMUNITY): Payer: No Payment, Other | Admitting: Physician Assistant
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
## 2020-08-21 ENCOUNTER — Other Ambulatory Visit: Payer: Self-pay

## 2020-08-21 ENCOUNTER — Ambulatory Visit (INDEPENDENT_AMBULATORY_CARE_PROVIDER_SITE_OTHER): Payer: No Payment, Other | Admitting: Behavioral Health

## 2020-08-21 DIAGNOSIS — F332 Major depressive disorder, recurrent severe without psychotic features: Secondary | ICD-10-CM
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
## 2020-08-25 ENCOUNTER — Telehealth (HOSPITAL_COMMUNITY): Payer: No Payment, Other | Admitting: Physician Assistant

## 2020-08-28 MED FILL — hydrOXYzine HCL 25 MG TABS: 25 | 30 days supply | Qty: 90 | Fill #1

## 2020-08-28 MED FILL — BUPROPION HCL XL 150 MG TAB: 150 | 30 days supply | Qty: 30 | Fill #1

## 2020-09-10 ENCOUNTER — Other Ambulatory Visit: Payer: Self-pay

## 2020-09-10 ENCOUNTER — Ambulatory Visit (INDEPENDENT_AMBULATORY_CARE_PROVIDER_SITE_OTHER): Payer: No Payment, Other | Admitting: Clinical

## 2020-09-10 ENCOUNTER — Telehealth (INDEPENDENT_AMBULATORY_CARE_PROVIDER_SITE_OTHER): Payer: No Payment, Other | Admitting: Physician Assistant

## 2020-09-10 DIAGNOSIS — F332 Major depressive disorder, recurrent severe without psychotic features: Secondary | ICD-10-CM

## 2020-09-10 DIAGNOSIS — G4700 Insomnia, unspecified: Secondary | ICD-10-CM | POA: Diagnosis not present

## 2020-09-10 DIAGNOSIS — F411 Generalized anxiety disorder: Secondary | ICD-10-CM
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
## 2020-10-02 MED FILL — BUPROPION HCL XL 150 MG TAB: 150 | 30 days supply | Qty: 30 | Fill #2

## 2020-10-08 ENCOUNTER — Encounter (HOSPITAL_COMMUNITY): Payer: Self-pay | Admitting: Physician Assistant

## 2020-10-13 ENCOUNTER — Other Ambulatory Visit (HOSPITAL_COMMUNITY): Payer: Self-pay | Admitting: Physician Assistant

## 2020-10-13 ENCOUNTER — Encounter (HOSPITAL_COMMUNITY): Payer: Self-pay | Admitting: Physician Assistant

## 2020-10-13 MED ORDER — HYDROXYZINE HCL 25 MG PO TABS: 25.0000 mg | ORAL_TABLET | Freq: Three times a day (TID) | ORAL | 1 refills | Status: DC | PRN

## 2020-10-13 MED ORDER — BUPROPION HCL ER (XL) 150 MG PO TB24: 150.0000 mg | ORAL_TABLET | Freq: Every day | ORAL | 2 refills | Status: DC

## 2020-10-13 MED FILL — hydrOXYzine HCL 25 MG TABS: 25 | 30 days supply | Qty: 90 | Fill #0

## 2020-10-13 NOTE — Progress Notes (Signed)
BH MD/PA/NP OP Progress Note  Virtual Visit via Telephone Note  I connected with Riley Smith on 09/10/2020 at  3:00 PM EST by telephone and verified that I am speaking with the correct person using two identifiers.  Location: Patient: Home Provider: Clinic   I discussed the limitations, risks, security and privacy concerns of performing an evaluation and management service by telephone and the availability of in person appointments. I also discussed with the patient that there may be a patient responsible charge related to this service. The patient expressed understanding and agreed to proceed.  Follow Up Instructions:    I discussed the assessment and treatment plan with the patient. The patient was provided an opportunity to ask questions and all were answered. The patient agreed with the plan and demonstrated an understanding of the instructions.   The patient was advised to call back or seek an in-person evaluation if the symptoms worsen or if the condition fails to improve as anticipated.  I provided 28 minutes of non-face-to-face time during this encounter.  Meta Hatchet, PA   09/10/2020 3:35 PM Riley Smith  MRN:  [id number]  Chief Complaint: Follow up and medication management  HPI:  Riley Smith is a 20 year old male with a past psychiatric history significant for major depressive disorder, anxiety, and insomnia who presents to [facility] for follow up and medication management.  Patient is currently being managed on the following medications:  Bupropion XL 150 mg 24-hour tablet daily Hydroxyzine 25 mg 3 times daily Trazodone 50 mg at bedtime  Patient reports that his experience with his medication regimen has been going well.  Patient reports no issues or concerns at this time and states that the medications have been working well. Patient reports that he has not been taking his trazodone as much and sometimes only  needs to take it once a week.  Patient reports that his insomnia has improved vastly since being placed on the medication.  Patient is requesting refills on his hydroxyzine and bupropion at this time.  Patient expresses that he has been experiencing what he refers to as a shifts in his personality.  Patient notes that on occasion, his personality may shift to a more feminine persona.  He states that this feminine persona can be cold and is prone to putting others down.  Patient has come to appreciate this feminine aspect although, it has caused him to question his gender.  Patient also endorses a depressed side that is usually unprovoked.  Patient states that he experiences these shifts before medications but they have increased in frequency since splitting with his ex.  Patient states that he is back to talking with his ex again and is trying to see if they can build a healthier/friendlier relationship. Patient denies any major concerns with his shifts personalities at this time.  Patient reports fluctuating mood that is influenced by how his day is going.  Overall, patient endorses that the medications have generally put him in a better head space.  Patient endorses passive suicidal ideations that he attributes to a falling out between him and a good friend.  Patient does not feel like he is a danger to himself.  Patient denies homicidal ideations.  He further denies auditory or visual hallucinations.  Patient endorses good sleep and receives on average 8 to 10 hours of sleep per night.  Patient endorses good appetite and eats on average 3 meals per day.  Patient endorses  alcohol consumption sparingly.  Patient denies tobacco use and illicit drug use.   Visit Diagnosis:    ICD-10-CM   1. Severe episode of recurrent major depressive disorder, without psychotic features (HCC)  F33.2 buPROPion (WELLBUTRIN XL) 150 MG 24 hr tablet  2. Anxiety state  F41.1 hydrOXYzine (ATARAX/VISTARIL) 25 MG tablet  3.  Insomnia, unspecified type  G47.00     Past Psychiatric History:  Major Depressive Disorder Anxiety Insomnia  Past Medical History: History reviewed. No pertinent past medical history. History reviewed. No pertinent surgical history.  Family Psychiatric History:  Unsure  Family History:  Family History  Problem Relation Age of Onset  . Healthy Mother   . Healthy Father     Social History:  Social History   Socioeconomic History  . Marital status: Single    Spouse name: Not on file  . Number of children: Not on file  . Years of education: Not on file  . Highest education level: Not on file  Occupational History  . Not on file  Tobacco Use  . Smoking status: Never Smoker  . Smokeless tobacco: Never Used  Substance and Sexual Activity  . Alcohol use: Never  . Drug use: Never  . Sexual activity: Not on file  Other Topics Concern  . Not on file  Social History Narrative  . Not on file   Social Determinants of Health   Financial Resource Strain: Not on file  Food Insecurity: Not on file  Transportation Needs: Not on file  Physical Activity: Not on file  Stress: Not on file  Social Connections: Not on file    Allergies: No Known Allergies  Metabolic Disorder Labs: Lab Results  Component Value Date   HGBA1C 5.2 07/02/2020   MPG 103 07/02/2020   No results found for: PROLACTIN Lab Results  Component Value Date   CHOL 184 07/02/2020   TRIG 207 (H) 07/02/2020   HDL 44 07/02/2020   CHOLHDL 4.2 07/02/2020   VLDL 41 (H) 07/02/2020   LDLCALC 99 07/02/2020   Lab Results  Component Value Date   TSH 4.570 (H) 07/02/2020    Therapeutic Level Labs: No results found for: LITHIUM No results found for: VALPROATE No components found for:  CBMZ  Current Medications: Current Outpatient Medications  Medication Sig Dispense Refill  . buPROPion (WELLBUTRIN XL) 150 MG 24 hr tablet Take 1 tablet (150 mg total) by mouth daily. 30 tablet 2  . hydrOXYzine  (ATARAX/VISTARIL) 25 MG tablet Take 1 tablet (25 mg total) by mouth 3 (three) times daily as needed for anxiety. 90 tablet 1  . traZODone (DESYREL) 50 MG tablet Take 1 tablet (50 mg total) by mouth at bedtime as needed for sleep. 30 tablet 2   No current facility-administered medications for this visit.     Musculoskeletal: Strength & Muscle Tone: Unable to assess due to telemedicine visit Gait & Station: Unable to assess due to telemedicine visit Patient leans: Unable to assess due to telemedicine visit  Psychiatric Specialty Exam: Review of Systems  Psychiatric/Behavioral: Positive for confusion and suicidal ideas. Negative for decreased concentration, dysphoric mood, hallucinations, self-injury and sleep disturbance. The patient is not nervous/anxious and is not hyperactive.     There were no vitals taken for this visit.There is no height or weight on file to calculate BMI.  General Appearance: Unable to assess due to telemedicine visit  Eye Contact:  Unable to assess due to telemedicine visit  Speech:  Clear and Coherent and Normal Rate  Volume:  Normal  Mood:  Euthymic  Affect:  Appropriate  Thought Process:  Coherent, Goal Directed and Descriptions of Associations: Intact  Orientation:  Full (Time, Place, and Person)  Thought Content: WDL and Logical   Suicidal Thoughts:  Yes.  without intent/plan  Homicidal Thoughts:  No  Memory:  Immediate;   Good Recent;   Good Remote;   Good  Judgement:  Good  Insight:  Fair  Psychomotor Activity:  Normal  Concentration:  Concentration: Good and Attention Span: Good  Recall:  Good  Fund of Knowledge: Good  Language: Good  Akathisia:  NA  Handed:  Right  AIMS (if indicated): not done  Assets:  Communication Skills Desire for Improvement Financial Resources/Insurance Housing Social Support  ADL's:  Intact  Cognition: WNL  Sleep:  Good   Screenings: AIMS   Flowsheet Row [facility]  AIMS Total Score 0    AUDIT   Flowsheet Row [facility]  Alcohol Use Disorder Identification Test Final Score (AUDIT) 2       Assessment and Plan:  Riley Smith is a 20 year old male with a past psychiatric history significant for major depressive disorder, anxiety, and insomnia who presents to [facility] for follow up and medication management.  Patient reports that he has been experiencing shifts in his personality that often occur before taking his medications.  Patient states that his personality may shift between a more feminine persona or depressed persona.  Patient denies any concerns with these shifts.  Patient reports that his experience with his medication regimen has been going well.  Patient reports no issues or concerns at this time and states that the medications have been working well.  Patient is requesting refills on his hydroxyzine and bupropion.  Patient's medications will be e-prescribed to his pharmacy of choice.  1. Severe episode of recurrent major depressive disorder, without psychotic features (HCC)  - buPROPion (WELLBUTRIN XL) 150 MG 24 hr tablet; Take 1 tablet (150 mg total) by mouth daily.  Dispense: 30 tablet; Refill: 2  2. Anxiety state  - hydrOXYzine (ATARAX/VISTARIL) 25 MG tablet; Take 1 tablet (25 mg total) by mouth 3 (three) times daily as needed for anxiety.  Dispense: 90 tablet; Refill: 1  3. Insomnia, unspecified type Patient to continue taking Trazodone 50 mg at bedtime for the management of his insomnia  Patient to follow up in 6 weeks  Meta Hatchet, PA 09/10/2020, 3:35 PM

## 2020-10-22 ENCOUNTER — Encounter (HOSPITAL_COMMUNITY): Payer: Self-pay | Admitting: Physician Assistant

## 2020-10-22 ENCOUNTER — Other Ambulatory Visit: Payer: Self-pay

## 2020-10-22 ENCOUNTER — Other Ambulatory Visit (HOSPITAL_COMMUNITY): Payer: Self-pay | Admitting: Physician Assistant

## 2020-10-22 ENCOUNTER — Telehealth (INDEPENDENT_AMBULATORY_CARE_PROVIDER_SITE_OTHER): Payer: No Payment, Other | Admitting: Physician Assistant

## 2020-10-22 DIAGNOSIS — F411 Generalized anxiety disorder: Secondary | ICD-10-CM

## 2020-10-22 DIAGNOSIS — G4700 Insomnia, unspecified: Secondary | ICD-10-CM | POA: Diagnosis not present

## 2020-10-22 DIAGNOSIS — F332 Major depressive disorder, recurrent severe without psychotic features: Secondary | ICD-10-CM | POA: Diagnosis not present

## 2020-10-22 MED ORDER — BUPROPION HCL ER (XL) 150 MG PO TB24: 150.0000 mg | ORAL_TABLET | Freq: Every day | ORAL | 2 refills | Status: DC

## 2020-10-22 MED ORDER — HYDROXYZINE HCL 25 MG PO TABS: 25.0000 mg | ORAL_TABLET | Freq: Three times a day (TID) | ORAL | 1 refills | Status: DC | PRN

## 2020-10-22 NOTE — Progress Notes (Signed)
BH MD/PA/NP OP Progress Note  Virtual Visit via Video Note  I connected with Riley Smith on 10/22/20 at  4:00 PM EST by a video enabled telemedicine application and verified that I am speaking with the correct person using two identifiers.  Location: Patient: Home Provider: Clinic   I discussed the limitations of evaluation and management by telemedicine and the availability of in person appointments. The patient expressed understanding and agreed to proceed.  Follow Up Instructions:   I discussed the assessment and treatment plan with the patient. The patient was provided an opportunity to ask questions and all were answered. The patient agreed with the plan and demonstrated an understanding of the instructions.   The patient was advised to call back or seek an in-person evaluation if the symptoms worsen or if the condition fails to improve as anticipated.  I provided 47 minutes of non-face-to-face time during this encounter.  Meta Hatchet, PA   10/22/2020 5:24 PM Riley Smith  MRN:  [id number]  Chief Complaint: Follow up and medication management  HPI:  Riley Smith is a 20 year old male with a past psychiatric history significant for major depressive disorder, anxiety, and insomnia who presents to [facility] for follow up and medication management.  Patient is currently being managed on the following medications:  Bupropion XL 150 mg 24-hour tablet daily Hydroxyzine 25 mg 3 times daily Trazodone 50 mg at bedtime  Patient was asked how he was doing to which he replied, "Could be better."  Patient reports that he has been having ongoing issues with his ex.  Before the end of 2021, patient had started to rekindle his relationship with his ex.  On New Year's, patient reports that his ex spent the night with him and had they ended having an intermittent encounter.  Shortly after the encounter, patient states that he and his ex would  constantly fight over the "label/meaning of their relationship.  Patient states that he was interested in wanting to work things out between him and his ex, however, his ex wanted to be just friends.  An argument between the patient and his ex ensued which resulted in the patient's ex breaking off ties with him.  Patient reports that he felt like he had a psychotic break after his ex cut off ties with him.  Patient reports that he changed his number numerous times so that he could try contacting his ex to work things out.  Since the break-up, patient states that he has experienced worsening depression and anxiety.  Patient reports that his anxiety has been so severe that it has caused him to be nauseous to the point of vomiting.  Patient also reports that he has not been eating or drinking as much.  Patient states that he has had an increase in suicidal ideations.  Patient reports that he has had a few episodes of participating in self-injurious behaviors through cutting.  To prevent from cutting himself, patient states that he has been engaging in drawing on himself as a way to cope.  Patient is calm, cooperative, and fully engaged in conversation during the encounter.  Patient reports that his current mood is decent and all right but he does not feel fantastic.  Patient denies current suicidal ideations but states that he currently has thoughts throughout the day with no plan to harm himself.  Patient reports that he does find himself saying remarks such as "Maybe I should die," in regards to  his current situation.  Patient endorses homicidal ideations towards his ex-girlfriend.  Patient states that he feels like punching his ex since she is pissing him off but reports that he would never act on those thoughts.  Patient denies auditory or visual hallucinations.  While conversing with the patient, it was noted that the patient was blinking excessively during the encounter.  Patient reports that these blinking  episodes have been occurring for roughly a month.  Patient endorses having OCD symptoms related to being bothered by clutter and being detail oriented.  Patient endorses fair sleep and states that he receives on average 6 to 8 hours of sleep each night.  He reports that he has not been falling asleep readily and has been waking up early at times.  Patient endorses fair appetite and eats on average 1-2 meals per day.  Patient denies alcohol consumption, tobacco use, and illicit drug use.  Visit Diagnosis:    ICD-10-CM   1. Severe episode of recurrent major depressive disorder, without psychotic features (HCC)  F33.2   2. Anxiety state  F41.1   3. Insomnia, unspecified type  G47.00     Past Psychiatric History:  Major Depressive Disorder Anxiety Insomnia  Past Medical History: No past medical history on file. No past surgical history on file.  Family Psychiatric History:  Unsure  Family History:  Family History  Problem Relation Age of Onset  . Healthy Mother   . Healthy Father     Social History:  Social History   Socioeconomic History  . Marital status: Single    Spouse name: Not on file  . Number of children: Not on file  . Years of education: Not on file  . Highest education level: Not on file  Occupational History  . Not on file  Tobacco Use  . Smoking status: Never Smoker  . Smokeless tobacco: Never Used  Substance and Sexual Activity  . Alcohol use: Never  . Drug use: Never  . Sexual activity: Not on file  Other Topics Concern  . Not on file  Social History Narrative  . Not on file   Social Determinants of Health   Financial Resource Strain: Not on file  Food Insecurity: Not on file  Transportation Needs: Not on file  Physical Activity: Not on file  Stress: Not on file  Social Connections: Not on file    Allergies: No Known Allergies  Metabolic Disorder Labs: Lab Results  Component Value Date   HGBA1C 5.2 07/02/2020   MPG 103 07/02/2020   No  results found for: PROLACTIN Lab Results  Component Value Date   CHOL 184 07/02/2020   TRIG 207 (H) 07/02/2020   HDL 44 07/02/2020   CHOLHDL 4.2 07/02/2020   VLDL 41 (H) 07/02/2020   LDLCALC 99 07/02/2020   Lab Results  Component Value Date   TSH 4.570 (H) 07/02/2020    Therapeutic Level Labs: No results found for: LITHIUM No results found for: VALPROATE No components found for:  CBMZ  Current Medications: Current Outpatient Medications  Medication Sig Dispense Refill  . buPROPion (WELLBUTRIN XL) 150 MG 24 hr tablet Take 1 tablet (150 mg total) by mouth daily. 30 tablet 2  . hydrOXYzine (ATARAX/VISTARIL) 25 MG tablet Take 1 tablet (25 mg total) by mouth 3 (three) times daily as needed for anxiety. 90 tablet 1  . traZODone (DESYREL) 50 MG tablet Take 1 tablet (50 mg total) by mouth at bedtime as needed for sleep. 30 tablet 2  No current facility-administered medications for this visit.     Musculoskeletal: Strength & Muscle Tone: within normal limits Gait & Station: normal Patient leans: N/A  Psychiatric Specialty Exam: Review of Systems  Psychiatric/Behavioral: Positive for sleep disturbance. Negative for decreased concentration, dysphoric mood, hallucinations, self-injury and suicidal ideas. The patient is nervous/anxious. The patient is not hyperactive.     There were no vitals taken for this visit.There is no height or weight on file to calculate BMI.  General Appearance: Fairly Groomed and Neat  Eye Contact:  Good  Speech:  Clear and Coherent and Normal Rate  Volume:  Normal  Mood:  Dysphoric  Affect:  Congruent and Depressed  Thought Process:  Coherent and Descriptions of Associations: Intact  Orientation:  Full (Time, Place, and Person)  Thought Content: WDL, Logical and Obsessions   Suicidal Thoughts:  No  Homicidal Thoughts:  No  Memory:  Immediate;   Good Recent;   Good Remote;   Good  Judgement:  Good  Insight:  Fair  Psychomotor Activity:  Normal   Concentration:  Concentration: Good and Attention Span: Good  Recall:  Good  Fund of Knowledge: Good  Language: Good  Akathisia:  NA  Handed:  Right  AIMS (if indicated): not done  Assets:  Communication Skills Desire for Improvement Financial Resources/Insurance Housing Social Support  ADL's:  Intact  Cognition: WNL  Sleep:  Fair   Screenings: AIMS   Flowsheet Row [facility]  AIMS Total Score 0    AUDIT   Flowsheet Row [facility]  Alcohol Use Disorder Identification Test Final Score (AUDIT) 2    Flowsheet Row [facility]) [facility] ED from [facility]01/2020 in [facility]  C-SSRS RISK CATEGORY Error: Q3, 4, or 5 should not be populated when Q2 is No High Risk       Assessment and Plan:   Riley Smith is a 20 year old male with a past psychiatric history significant for major depressive disorder, anxiety, and insomnia who presents to [facility] for follow up and medication management.   Patient endorses worsening depression and anxiety due to a recent split between him and his ex.  During the split, patient reports that he feels like he experienced a psychotic break.  Patient endorses irritability, worsening mood, nausea to the point of vomiting, and self injurious behavior.  Patient has also been experiencing episodes of obsessive thoughts and increased blinking.  Patient was recommended either adjusting his bupropion to combat his worsening depression or to manage his feelings regarding his recent split with his ex.  Patient reports that he rather try to focus on his emotions regarding his current situation before increasing his dosage of his bupropion.  States that he did try to take up  meditation and exercising to help manage his emotions.  Patient was also recommended being placed on medication to help manage his obsessive thoughts and excessive blinking.  Patient would like to hold off on being placed on another medication to help with his obsessive thoughts and excessive blinking.  Patient would prefer to work on his current situation and be reassessed on subsequent encounters.  Patient was provided information for Behavioral Health Urgent Care if he ever finds himself in a state of crisis.  Patient was encouraged to continue taking trazodone to help manage his sleep  disturbances.  Patient's medications will be e-prescribed to pharmacy of choice.  1. Severe episode of recurrent major depressive disorder, without psychotic features (HCC)  - buPROPion (WELLBUTRIN XL) 150 MG 24 hr tablet; Take 1 tablet (150 mg total) by mouth daily.  Dispense: 30 tablet; Refill: 2  2. Anxiety state  - hydrOXYzine (ATARAX/VISTARIL) 25 MG tablet; Take 1 tablet (25 mg total) by mouth 3 (three) times daily as needed for anxiety.  Dispense: 90 tablet; Refill: 1  3. Insomnia, unspecified type Patient to continue taking Trazodone 50 mg at bedtime for the management of his sleep disturbances.  Patient to follow-up in 5 weeks  Meta Hatchet, PA 10/22/2020, 5:24 PM

## 2020-10-23 MED FILL — hydrOXYzine HCL 25 MG TABS: 25 | 30 days supply | Qty: 90 | Fill #0

## 2020-10-26 ENCOUNTER — Other Ambulatory Visit: Payer: Self-pay

## 2020-10-26 ENCOUNTER — Encounter (HOSPITAL_COMMUNITY): Payer: Self-pay | Admitting: *Deleted

## 2020-10-26 ENCOUNTER — Emergency Department (HOSPITAL_COMMUNITY)
Admission: EM | Admit: 2020-10-26 | Discharge: 2020-10-26 | Disposition: A | Discharge status: Home / Self Care | Payer: HRSA Program | Attending: Emergency Medicine | Admitting: Emergency Medicine

## 2020-10-26 DIAGNOSIS — J069 Acute upper respiratory infection, unspecified: Secondary | ICD-10-CM | POA: Insufficient documentation

## 2020-10-26 DIAGNOSIS — U071 COVID-19: Secondary | ICD-10-CM | POA: Insufficient documentation

## 2020-10-26 DIAGNOSIS — R059 Cough, unspecified: Secondary | ICD-10-CM | POA: Diagnosis present

## 2020-10-26 DIAGNOSIS — Z20822 Contact with and (suspected) exposure to covid-19: Secondary | ICD-10-CM

## 2020-10-26 LAB — SARS CORONAVIRUS 2 (TAT 6-24 HRS): SARS Coronavirus 2: POSITIVE — AB

## 2020-10-26 MED ORDER — LIDOCAINE VISCOUS HCL 2 % MT SOLN
15.0000 mL | Freq: Once | OROMUCOSAL | Status: AC
  Administered 2020-10-26: 15 mL via OROMUCOSAL
  Filled 2020-10-26: qty 15

## 2020-10-26 MED ORDER — ACETAMINOPHEN 325 MG PO TABS
650.0000 mg | ORAL_TABLET | Freq: Once | ORAL | Status: AC
  Administered 2020-10-26: 650 mg via ORAL
  Filled 2020-10-26: qty 2

## 2020-10-26 NOTE — ED Triage Notes (Signed)
Pt reports cough, headache, fever, sore throat since Friday. No acute distress noted at triage.

## 2020-10-26 NOTE — ED Provider Notes (Signed)
[facility] EMERGENCY DEPARTMENT Provider Note   CSN: [id number] Arrival date & time: 10/26/20  1015     History Chief Complaint  Patient presents with  . Headache  . Cough    Riley Smith is a 20 y.o. male with noncontributory past medical history.  Had COVID vaccinations.  HPI Patient presents to emergency department today with chief complaint of headache and cough x4 days.  He is also endorsing subjective fever, chills, sinus congestion,and sore throat.  He states his symptoms have progressively worsened.  He describes headache as pain located in his forehead.  He describes the pain as throbbing.  Pain does not radiate.  He denies any associated neck pain or stiffness.  He has no headache currently.  He has tried taking over-the-counter cold medicine without symptom improvement.  He is tolerating p.o. intake without difficulty.  No sick contacts or known Covid exposures. Denies syncope, head trauma, photophobia, phonophobia, N/V, visual changes,  rash, or "thunderclap" onset.     History reviewed. No pertinent past medical history.  Patient Active Problem List   Diagnosis Date Noted  . Insomnia 07/14/2020  . Severe episode of recurrent major depressive disorder, without psychotic features (HCC) 07/02/2020  . Suicide attempt (HCC) 07/02/2020  . Anxiety state 07/02/2020    History reviewed. No pertinent surgical history.     Family History  Problem Relation Age of Onset  . Healthy Mother   . Healthy Father     Social History   Tobacco Use  . Smoking status: Never Smoker  . Smokeless tobacco: Never Used  Substance Use Topics  . Alcohol use: Never  . Drug use: Never    Home Medications Prior to Admission medications   Medication Sig Start Date End Date Taking? Authorizing Provider  buPROPion (WELLBUTRIN XL) 150 MG 24 hr tablet Take 1 tablet (150 mg total) by mouth daily. 10/22/20   Nwoko, Tommas Olp, PA  hydrOXYzine (ATARAX/VISTARIL) 25 MG  tablet Take 1 tablet (25 mg total) by mouth 3 (three) times daily as needed for anxiety. 10/22/20   Nwoko, Tommas Olp, PA  traZODone (DESYREL) 50 MG tablet Take 1 tablet (50 mg total) by mouth at bedtime as needed for sleep. 07/17/20   Meta Hatchet, PA    Allergies    Patient has no known allergies.  Review of Systems   Review of Systems All other systems are reviewed and are negative for acute change except as noted in the HPI.  Physical Exam Updated Vital Signs BP 110/65 (BP Location: Left Arm)   Pulse 95   Temp 98.9 F (37.2 C) (Oral)   Resp 18   SpO2 97%   Physical Exam Vitals and nursing note reviewed.  Constitutional:      Appearance: He is well-developed. He is not ill-appearing or toxic-appearing.  HENT:     Head: Normocephalic and atraumatic.     Comments: No sinus or temporal tenderness.    Nose: Nose normal.     Mouth/Throat:     Comments: Minor erythema to oropharynx, no edema, no exudate, no tonsillar swelling, voice normal, neck supple without lymphadenopathy  Eyes:     General: No scleral icterus.       Right eye: No discharge.        Left eye: No discharge.     Conjunctiva/sclera: Conjunctivae normal.  Neck:     Vascular: No JVD.     Comments: Full ROM intact without spinous process TTP. No bony stepoffs  or deformities, no paraspinous muscle TTP or muscle spasms. No rigidity or meningeal signs. No bruising, erythema, or swelling.  Cardiovascular:     Rate and Rhythm: Normal rate and regular rhythm.     Pulses: Normal pulses.     Heart sounds: Normal heart sounds.  Pulmonary:     Effort: Pulmonary effort is normal.     Breath sounds: Normal breath sounds.  Abdominal:     General: There is no distension.  Musculoskeletal:        General: Normal range of motion.     Cervical back: Normal range of motion.  Skin:    General: Skin is warm and dry.  Neurological:     Mental Status: He is oriented to person, place, and time.     GCS: GCS eye subscore  is 4. GCS verbal subscore is 5. GCS motor subscore is 6.     Comments: Speech is clear and goal oriented, follows commands CN III-XII intact, no facial droop Normal strength in upper and lower extremities bilaterally including dorsiflexion and plantar flexion, strong and equal grip strength Sensation normal to light and sharp touch Moves extremities without ataxia, coordination intact Normal finger to nose and rapid alternating movements Normal gait and balance   Psychiatric:        Behavior: Behavior normal.     ED Results / Procedures / Treatments   Labs (all labs ordered are listed, but only abnormal results are displayed) Labs Reviewed  SARS CORONAVIRUS 2 (TAT 6-24 HRS)    EKG None  Radiology No results found.  Procedures Procedures   Medications Ordered in ED Medications  acetaminophen (TYLENOL) tablet 650 mg (650 mg Oral Given 10/26/20 1307)  lidocaine (XYLOCAINE) 2 % viscous mouth solution 15 mL (15 mLs Mouth/Throat Given 10/26/20 1307)    ED Course  I have reviewed the triage vital signs and the nursing notes.  Pertinent labs & imaging results that were available during my care of the patient were reviewed by me and considered in my medical decision making (see chart for details).    MDM Rules/Calculators/A&P                          History provided by patient with additional history obtained from chart review.    Symptoms and exam most suggestive of uncomplicated viral illness. DDX incluldes viral URI/LRI, COVID-19.  Exam is benign.  Normal WOB. No fever, tachypnea, tachycardia, hypoxemia. Lungs are CTAB. I do not think that a CXR is indicated at this time as VS are WNL, there are no signs of consolidation on auscultation and there is no hypoxia, increased WOB or other concerning features to exam. No significant h/o immunocompromise. Doubt bacterial bronchitis or pneumonia.  No signs or symptoms to suggest strep pharyngitis. No headache currently and neuro  exam is normal.  No clinical signs of severe illness, dehydration, to warrant further emergent work up in ER. Covid test is in process. Patient ambulated in the emergency department without respiratory distress or hypoxia, SpO2 >95% on room air.  Given reassuring physical exam, symptoms, will discharge with symptomatic treatment. Recommend telemedicine PCP f/u in the next 2-3 days for persistent symptoms  for further guidance. Self-isolation instructions discussed. Pt was given home self-isolation instructions and instructions for family members.   The patient appears reasonably screened and/or stabilized for discharge and I doubt any other medical condition or other [facility] requiring further screening, evaluation, or treatment in the ED  at this time prior to discharge. The patient is safe for discharge with strict return precautions discussed.   Riley Smith was evaluated in Emergency Department on 10/26/2020 for the symptoms described in the history of present illness. He was evaluated in the context of the global COVID-19 pandemic, which necessitated consideration that the patient might be at risk for infection with the SARS-CoV-2 virus that causes COVID-19. Institutional protocols and algorithms that pertain to the evaluation of patients at risk for COVID-19 are in a state of rapid change based on information released by regulatory bodies including the CDC and federal and state organizations. These policies and algorithms were followed during the patient's care in the ED.    Portions of this note were generated with Scientist, clinical (histocompatibility and immunogenetics). Dictation errors may occur despite best attempts at proofreading.   Final Clinical Impression(s) / ED Diagnoses Final diagnoses:  Upper respiratory tract infection, unspecified type  COVID-19 virus test result unknown    Rx / DC Orders ED Discharge Orders    None       Kandice Hams 10/26/20 1312    Cathren Laine, MD 10/27/20  1414

## 2020-10-26 NOTE — Discharge Instructions (Signed)
Thank you for allowing us to care for you today.   Please return to the emergency department if you have any new or worsening symptoms.   You have a pending covid test. If positive you will receive a phone call. Results will be available on your MyChart. You should quarantine until you have test result.  Medications- You can take medications to help treat your symptoms: -Tylenol for fever and body aches. Please take as prescribed on the bottle. -Over the coutner cough medicine such as mucinex, robitussin, or other brands. -Flonase or saline nasal spray for nasal congestion -Vitamins as recommended by CDC: vitamin C, D and Zinc  Treatment- This is a virus and unfortunately there are no antibitotics approved to treat this virus at this time. It is important to monitor your symptoms closely: -You should have a theremometer at home to check your temperature when feeling feverish. -Use a pulse ox meter to measure your oxygen when feeling short of breath.  -If your fever is over 100.4 despite taking tylenol or if your oxygen level drops below 92% these are reasons to return to the emergency department for further evaluation.   -CDC quarantine guidelines are asking you to isolate for 5 days then -You can return to work, school or normal activities if on day 6 you are fever free without the use of Tylenol or ibuprofen. You need to wear a mask for the next 5 days. -If you are still having fever or sever symptoms on day 5 you need to continue isolation until day 10.   -If family members are wanting to get tested there are multiple sites in the commnuity to get an outpatient test. Go online and search for "covid testing near me." If family members are having symptoms they should follow the same quarantine guidelines.  Again: symptoms of shortness of breath, chest pain, difficulty breathing, new onset of confusion, any symptoms that are concerning. If any of these symptoms you should come to emergency  department for evaluation.   I hope you feel better soon  

## 2020-10-26 NOTE — ED Notes (Signed)
Patient Alert and oriented to baseline. Stable and ambulatory to baseline. Patient verbalized understanding of the discharge instructions.  Patient belongings were taken by the patient.   

## 2020-10-31 ENCOUNTER — Other Ambulatory Visit: Payer: Self-pay

## 2020-10-31 ENCOUNTER — Emergency Department (HOSPITAL_COMMUNITY)
Admission: EM | Admit: 2020-10-31 | Discharge: 2020-11-02 | Disposition: A | Discharge status: Home / Self Care | Payer: Medicaid Other | Attending: Emergency Medicine | Admitting: Emergency Medicine

## 2020-10-31 ENCOUNTER — Ambulatory Visit (HOSPITAL_COMMUNITY)
Admission: EM | Admit: 2020-10-31 | Discharge: 2020-10-31 | Disposition: A | Discharge status: Other Acute Inpatient Hospital | Payer: No Payment, Other | Attending: Psychiatry | Admitting: Psychiatry

## 2020-10-31 ENCOUNTER — Encounter (HOSPITAL_COMMUNITY): Payer: Self-pay | Admitting: Emergency Medicine

## 2020-10-31 DIAGNOSIS — J029 Acute pharyngitis, unspecified: Secondary | ICD-10-CM | POA: Insufficient documentation

## 2020-10-31 DIAGNOSIS — R109 Unspecified abdominal pain: Secondary | ICD-10-CM | POA: Insufficient documentation

## 2020-10-31 DIAGNOSIS — Z1330 Encounter for screening examination for mental health and behavioral disorders, unspecified: Secondary | ICD-10-CM | POA: Insufficient documentation

## 2020-10-31 DIAGNOSIS — F332 Major depressive disorder, recurrent severe without psychotic features: Secondary | ICD-10-CM | POA: Diagnosis present

## 2020-10-31 DIAGNOSIS — R45851 Suicidal ideations: Secondary | ICD-10-CM

## 2020-10-31 DIAGNOSIS — T1491XA Suicide attempt, initial encounter: Secondary | ICD-10-CM | POA: Diagnosis present

## 2020-10-31 DIAGNOSIS — G4700 Insomnia, unspecified: Secondary | ICD-10-CM | POA: Diagnosis present

## 2020-10-31 DIAGNOSIS — Z23 Encounter for immunization: Secondary | ICD-10-CM | POA: Insufficient documentation

## 2020-10-31 DIAGNOSIS — R0981 Nasal congestion: Secondary | ICD-10-CM | POA: Insufficient documentation

## 2020-10-31 DIAGNOSIS — R059 Cough, unspecified: Secondary | ICD-10-CM | POA: Insufficient documentation

## 2020-10-31 DIAGNOSIS — R1110 Vomiting, unspecified: Secondary | ICD-10-CM | POA: Insufficient documentation

## 2020-10-31 DIAGNOSIS — Z8616 Personal history of COVID-19: Secondary | ICD-10-CM | POA: Insufficient documentation

## 2020-10-31 LAB — CBC WITH DIFFERENTIAL/PLATELET
Abs Immature Granulocytes: 0.01 10*3/uL (ref 0.00–0.07)
Basophils Absolute: 0 10*3/uL (ref 0.0–0.1)
Basophils Relative: 1 %
Eosinophils Absolute: 0.1 10*3/uL (ref 0.0–0.5)
Eosinophils Relative: 1 %
HCT: 46.4 % (ref 39.0–52.0)
Hemoglobin: 16.2 g/dL (ref 13.0–17.0)
Immature Granulocytes: 0 %
Lymphocytes Relative: 26 %
Lymphs Abs: 1.6 10*3/uL (ref 0.7–4.0)
MCH: 29.5 pg (ref 26.0–34.0)
MCHC: 34.9 g/dL (ref 30.0–36.0)
MCV: 84.5 fL (ref 80.0–100.0)
Monocytes Absolute: 0.5 10*3/uL (ref 0.1–1.0)
Monocytes Relative: 8 %
Neutro Abs: 3.8 10*3/uL (ref 1.7–7.7)
Neutrophils Relative %: 64 %
Platelets: 336 10*3/uL (ref 150–400)
RBC: 5.49 MIL/uL (ref 4.22–5.81)
RDW: 12.3 % (ref 11.5–15.5)
WBC: 6 10*3/uL (ref 4.0–10.5)
nRBC: 0 % (ref 0.0–0.2)

## 2020-10-31 LAB — ETHANOL: Alcohol, Ethyl (B): <10 mg/dL (ref <10)

## 2020-10-31 LAB — RAPID URINE DRUG SCREEN, HOSP PERFORMED
Amphetamines: NOT DETECTED
Barbiturates: NOT DETECTED
Benzodiazepines: NOT DETECTED
Cocaine: NOT DETECTED
Opiates: NOT DETECTED
Tetrahydrocannabinol: NOT DETECTED

## 2020-10-31 LAB — COMPREHENSIVE METABOLIC PANEL
ALT: 18 U/L (ref 0–44)
AST: 19 U/L (ref 15–41)
Albumin: 4.8 g/dL (ref 3.5–5.0)
Alkaline Phosphatase: 52 U/L (ref 38–126)
Anion gap: 10 (ref 5–15)
BUN: 6 mg/dL (ref 6–20)
CO2: 26 mmol/L (ref 22–32)
Calcium: 9.3 mg/dL (ref 8.9–10.3)
Chloride: 101 mmol/L (ref 98–111)
Creatinine, Ser: 0.99 mg/dL (ref 0.61–1.24)
GFR, Estimated: >60 mL/min (ref >60)
Glucose, Bld: 91 mg/dL (ref 70–99)
Potassium: 3.7 mmol/L (ref 3.5–5.1)
Sodium: 137 mmol/L (ref 135–145)
Total Bilirubin: 1.2 mg/dL (ref 0.3–1.2)
Total Protein: 8.1 g/dL (ref 6.5–8.1)

## 2020-10-31 LAB — ACETAMINOPHEN LEVEL: Acetaminophen (Tylenol), Serum: <10 ug/mL — ABNORMAL LOW (ref 10–30)

## 2020-10-31 LAB — SALICYLATE LEVEL: Salicylate Lvl: <7 mg/dL — ABNORMAL LOW (ref 7.0–30.0)

## 2020-10-31 MED ORDER — HYDROXYZINE HCL 25 MG PO TABS
25.0000 mg | ORAL_TABLET | Freq: Three times a day (TID) | ORAL | Status: DC | PRN
  Administered 2020-11-01: 25 mg via ORAL
  Filled 2020-10-31: qty 1

## 2020-10-31 MED ORDER — TRAZODONE HCL 100 MG PO TABS: 50.0000 mg | ORAL_TABLET | Freq: Every evening | ORAL | Status: DC | PRN

## 2020-10-31 MED ORDER — BUPROPION HCL ER (XL) 150 MG PO TB24
150.0000 mg | ORAL_TABLET | Freq: Every day | ORAL | Status: DC
  Administered 2020-11-01 – 2020-11-02 (×2): 150 mg via ORAL
  Filled 2020-10-31 (×2): qty 1

## 2020-10-31 MED ORDER — TETANUS-DIPHTH-ACELL PERTUSSIS 5-2.5-18.5 LF-MCG/0.5 IM SUSY
0.5000 mL | PREFILLED_SYRINGE | Freq: Once | INTRAMUSCULAR | Status: AC
  Administered 2020-10-31: 0.5 mL via INTRAMUSCULAR
  Filled 2020-10-31: qty 0.5

## 2020-10-31 NOTE — BH Assessment (Signed)
Triage Note:  Patient is a 20 y.o. male with a history of anxiety and depression who presents voluntarily reporting worsening depression and a suicide attempt by overdose last night.  He reports social stressors and issues with an ex-girlfriend as the trigger for the attempt.  He reports taking 3-4 of rx wellbutrin, 3-4 trazadone and 4-5 tylenol in an attempt.  He also engaged in cutting, and shows multiple superficial cuts to left forearm.  He states when he woke up today, he was upset that he was still alive and states he had thoughts to cut his throat with a knife.  He talked to friends, who encouraged him to get help.  Patient's mother brought him in today.  Patient is agreeable with inpatient treatment if recommended.

## 2020-10-31 NOTE — ED Notes (Signed)
Pt has one pt bag with his cloths in it and one backpack which has more cloths and a book. He also has a cell phone and headphone in his backpack.

## 2020-10-31 NOTE — ED Provider Notes (Signed)
[facility]   CSN: [id number] Arrival date & time: 10/31/20  1408     History Chief Complaint  Patient presents with  . Suicidal  . Covid Positive    Riley Smith is a 20 y.o. male.  HPI   20 year old male with a history of SI, major depressive disorder, suicide attempt, anxiety state, insomnia, who presents to the emergency department today for evaluation of suicidal ideations.  Patient was seen at behavioral health urgent care prior to arrival for suicidal ideations and attempted overdose.  He was recommended for inpatient treatment however he recently tested positive for Covid so he was sent here for further evaluation.  Pt states he took 3-4 tablets of 150mg  Buproprion, 4 tablets of 50 mg trazodone, 5 tablets of an unknown strength of Tylenol, 3 tablets of 25 mg of hydroxyzine.  States following this he had several episodes of vomiting and now has some mild diffuse abdominal pain.  Vomiting has improved since last night.  Denies any diarrhea at this time.  He further notes in relation to his Covid he started having symptoms 1 week ago today.  He reports nasal congestion, intermittent cough and a sore throat but states that these symptoms have improved since onset.  He denies any current chest pain or shortness of breath.  History reviewed. No pertinent past medical history.  Patient Active Problem List   Diagnosis Date Noted  . Suicidal ideations 10/31/2020  . Insomnia 07/14/2020  . Severe episode of recurrent major depressive disorder, without psychotic features (HCC) 07/02/2020  . Suicide attempt (HCC) 07/02/2020  . Anxiety state 07/02/2020    History reviewed. No pertinent surgical history.     Family History  Problem Relation Age of Onset  . Healthy Mother   . Healthy Father     Social History   Tobacco Use  . Smoking status: Never Smoker  . Smokeless tobacco: Never Used  Substance Use Topics  . Alcohol use:  Never  . Drug use: Never    Home Medications Prior to Admission medications   Medication Sig Start Date End Date Taking? Authorizing Provider  buPROPion (WELLBUTRIN XL) 150 MG 24 hr tablet Take 1 tablet (150 mg total) by mouth daily. 10/22/20   Nwoko, 12/20/20, PA  hydrOXYzine (ATARAX/VISTARIL) 25 MG tablet Take 1 tablet (25 mg total) by mouth 3 (three) times daily as needed for anxiety. 10/22/20   Nwoko, 12/20/20, PA  traZODone (DESYREL) 50 MG tablet Take 1 tablet (50 mg total) by mouth at bedtime as needed for sleep. 07/17/20   13/5/21, PA    Allergies    Patient has no known allergies.  Review of Systems   Review of Systems  Constitutional: Negative for chills and fever.  HENT: Positive for congestion and sore throat. Negative for ear pain.   Eyes: Negative for pain and visual disturbance.  Respiratory: Positive for cough. Negative for shortness of breath.   Cardiovascular: Negative for chest pain.  Gastrointestinal: Positive for abdominal pain, nausea and vomiting. Negative for constipation and diarrhea.  Genitourinary: Negative for dysuria and hematuria.  Musculoskeletal: Negative for back pain.  Skin: Negative for rash.  Neurological: Negative for headaches.  All other systems reviewed and are negative.   Physical Exam Updated Vital Signs BP 133/85   Pulse 90   Temp 98.3 F (36.8 C) (Oral)   Resp 18   SpO2 96%   Physical Exam Vitals and nursing note reviewed.  Constitutional:  Appearance: He is well-developed and well-nourished.  HENT:     Head: Normocephalic and atraumatic.  Eyes:     Conjunctiva/sclera: Conjunctivae normal.  Cardiovascular:     Rate and Rhythm: Normal rate and regular rhythm.     Pulses: Normal pulses.     Heart sounds: Normal heart sounds. No murmur heard.   Pulmonary:     Effort: Pulmonary effort is normal. No respiratory distress.     Breath sounds: Normal breath sounds. No wheezing, rhonchi or rales.  Abdominal:      General: Bowel sounds are normal.     Palpations: Abdomen is soft.     Tenderness: There is no abdominal tenderness. There is no guarding or rebound.  Musculoskeletal:        General: No edema.     Cervical back: Neck supple.  Skin:    General: Skin is warm and dry.  Neurological:     Mental Status: He is alert.  Psychiatric:        Mood and Affect: Mood and affect normal.     ED Results / Procedures / Treatments   Labs (all labs ordered are listed, but only abnormal results are displayed) Labs Reviewed  ACETAMINOPHEN LEVEL - Abnormal; Notable for the following components:      Result Value   Acetaminophen (Tylenol), Serum <10 (*)    All other components within normal limits  SALICYLATE LEVEL - Abnormal; Notable for the following components:   Salicylate Lvl <7.0 (*)    All other components within normal limits  COMPREHENSIVE METABOLIC PANEL  CBC WITH DIFFERENTIAL/PLATELET  ETHANOL  RAPID URINE DRUG SCREEN, HOSP PERFORMED    EKG None  Radiology No results found.  Procedures Procedures   Medications Ordered in ED Medications  buPROPion (WELLBUTRIN XL) 24 hr tablet 150 mg (has no administration in time range)  hydrOXYzine (ATARAX/VISTARIL) tablet 25 mg (has no administration in time range)  traZODone (DESYREL) tablet 50 mg (has no administration in time range)  Tdap (BOOSTRIX) injection 0.5 mL (0.5 mLs Intramuscular Given 10/31/20 1640)    ED Course  I have reviewed the triage vital signs and the nursing notes.  Pertinent labs & imaging results that were available during my care of the patient were reviewed by me and considered in my medical decision making (see chart for details).    MDM Rules/Calculators/A&P                          20 y/o M presenting for eval of SI. States he overdosed on some pills yesterday.  He did have some vomiting following this but that has since improved.  He was seen at behavioral health urgent care prior to arrival and  recommended for inpatient admission but sent here because he was recently Covid positive.  2:51 PM Discussed case with Kristi with poison control. She states that as long as the patients CMP And Tylenol level are wnl then patient can be medically cleared.   Reviewed/interpreted labs CBC unremarkable CMP unremarkable Tylenol neg Salicylate neg EtOH neg  Labs wnl. Discussed case with poison control control and they state the patient can be medically cleared and his home medications can be resumed tomorrow safely.  Patient is currently not IVC at this time but is recommended for inpatient treatment.  At shift change his care is transitioned to the default provider.  The patient has been placed in psychiatric observation due to the need to provide a safe  environment for the patient while obtaining psychiatric consultation and evaluation, as well as ongoing medical and medication management to treat the patient's condition.  The patient has not been placed under full IVC at this time.   Final Clinical Impression(s) / ED Diagnoses Final diagnoses:  Suicidal ideation    Rx / DC Orders ED Discharge Orders    None       Karrie Meres, PA-C 10/31/20 1644    Tegeler, Canary Brim, MD 11/01/20 [phone]

## 2020-10-31 NOTE — ED Triage Notes (Signed)
Per EMS, patient from [facility], SI with superficial lac to left forearm. Patient is Covid positive.
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
## 2020-10-31 NOTE — ED Notes (Signed)
Patient belongings remain secured at triage nurse's station.

## 2020-11-01 NOTE — ED Notes (Signed)
Breakfast tray provided. 

## 2020-11-01 NOTE — ED Notes (Signed)
Patient was given his dinner tray.

## 2020-11-01 NOTE — ED Notes (Signed)
Patient was given his lunch tray.

## 2020-11-01 NOTE — ED Provider Notes (Signed)
Emergency Medicine Observation Re-evaluation Note  Riley Smith is a 20 y.o. male, seen on rounds today.  Pt initially presented to the ED for complaints of Suicidal and Covid Positive Currently, the patient is awaiting inpatient management for suicidal ideation.  Also has COVID.  Physical Exam  BP 106/74 (BP Location: Right Arm)   Pulse 68   Temp 98.3 F (36.8 C) (Oral)   Resp 17   SpO2 100%  Physical Exam General: Resting comfortably, ate all breakfast now watching TV. Cardiac: No murmur Lungs: Lungs clear on my exam with no respiratory complaints currently Psych: Resting comfortably with no complaints  ED Course / MDM  EKG:EKG Interpretation  Date/Time:  Saturday October 31 2020 15:52:49 EST Ventricular Rate:  90 PR Interval:    QRS Duration: 82 QT Interval:  342 QTC Calculation: 419 R Axis:   67 Text Interpretation: Sinus rhythm Early repolarization When compared with ECG of 07/01/2020, No significant change was found Confirmed by Dione Booze ([id number]) on 11/01/2020 6:28:13 AM    I have reviewed the labs performed to date as well as medications administered while in observation.  Recent changes in the last 24 hours include none.  Plan  Current plan is for inpatient management. Patient is not under full IVC at this time.   Tegeler, Canary Brim, MD 11/01/20 1031

## 2020-11-02 ENCOUNTER — Encounter (HOSPITAL_COMMUNITY): Payer: Self-pay | Admitting: Registered Nurse

## 2020-11-02 DIAGNOSIS — T1491XA Suicide attempt, initial encounter: Secondary | ICD-10-CM

## 2020-11-02 DIAGNOSIS — G4700 Insomnia, unspecified: Secondary | ICD-10-CM

## 2020-11-02 DIAGNOSIS — R45851 Suicidal ideations: Secondary | ICD-10-CM | POA: Insufficient documentation

## 2020-11-02 DIAGNOSIS — F332 Major depressive disorder, recurrent severe without psychotic features: Secondary | ICD-10-CM

## 2020-11-02 NOTE — ED Notes (Signed)
Pt given ice water.

## 2020-11-02 NOTE — Discharge Instructions (Addendum)
For your behavioral health needs, you are advised to follow up with the Partial Hospitalization Program (PHP) at the [facility] [place].  This program meets Monday - Friday from 9:00 am - 1:00 pm.  Due to Covid-19, this program is currently virtual.  Baldo Daub, LCSW, who runs the program, will be reaching out to you to schedule your admission into the program.  If you have any questions, contact her at the phone number indicated below:       [facility]      [address] [place] [address], [place] [address]      Contact person: Baldo Daub, LCSW      [phone]

## 2020-11-02 NOTE — ED Notes (Signed)
Pt given lunch tray.

## 2020-11-02 NOTE — ED Notes (Signed)
Pt belongings returned to pt. Pt belongings include one pt belongings bag and one black backpack.

## 2020-11-02 NOTE — ED Notes (Signed)
Breakfast tray given to pt 

## 2020-11-02 NOTE — BH Assessment (Signed)
[facility] Assessment Progress Note  Per Shuvon Rankin, NP, this voluntary pt does not require psychiatric hospitalization at this time.  Pt is psychiatrically cleared.  Pt is recommended for the Partial Hospitalization Program at [facility].  Due to Covid-19, this program is currently virtual.  This Clinical research associate has spoken to pt and he agrees to enroll. Pt reports that he has Internet connectivity at home, as well as a device for virtual programming.  I have contacted Baldo Daub, LCSW who runs the program, and she agrees to reach out to pt in the community to arrange for admission.  I have provided her with pt's phone number and e-mail address, and have included contact information for the program in pt's discharge instructions.  EDP Arby Barrette, MD and pt's nurse, Abby, have been notified.  Doylene Canning, MA Triage Specialist [phone]

## 2020-11-02 NOTE — ED Notes (Signed)
A black backpack and a pt belonging bag placed in 1-4 nurses station

## 2020-11-02 NOTE — Consult Note (Signed)
[facility] Face-to-Face Psychiatry Consult   Reason for Consult:  Suicidal ideation Referring Physician:  Nelly Rout, MD Patient Identification: Riley Smith MRN:  [id number] Principal Diagnosis: Severe episode of recurrent major depressive disorder, without psychotic features (HCC) Diagnosis:  Principal Problem:   Severe episode of recurrent major depressive disorder, without psychotic features (HCC) Active Problems:   Suicide attempt (HCC)   Insomnia   Suicidal ideations   Total Time spent with patient: 30 minutes  Subjective:   Riley Smith is a 20 y.o. male patient admitted to [facility] ED from [facility] where he presented with complaints of an attempted suicide via overdose.  HPI:  Riley Smith, 20 y.o., male patient seen face to face by this provider, consulted with Dr. Nelly Rout; and chart reviewed on 11/02/20.  On evaluation ASEEL Smith reports he was feeling overwhelmed yesterday and depressed with he took an overdose in a suicide attempt.  Patient states he is feeling better today.  Patient states feeling better and no suicidal thoughts now.  Patient states that he has outpatient psychiatric services with [facility] for therapy and medication management but unsure when his next appointment is.  Patient states he lives with his parents and siblings and that he is also employed.  patient states that he is also prescribed psychotropic medications that he is taking.  Patient is aware that he has tested positive for COVID.  Patient gave permission to speak to his mother for collateral information Luccas Towell at [phone].  Patient states that he is interested in the partial hospitalization program (PHP) related to being COVID + and difficult to find inpatient psychiatric treatment would like to start soon instead of waiting in ED. During evaluation Riley Smith is sitting on side of bed in no acute distress.  He is alert, oriented x 4, calm and cooperative.  His mood is depressed with  congruent affect.  He does not appear to be responding to internal/external stimuli or delusional thoughts.  Patient denies suicidal/self-harm/homicidal ideation, psychosis, and paranoia.  Patient answered question appropriately.  Collateral Information:  Riley Smith [phone] patients mother:  Ms. Dissinger state she has no problem with patient coming home and agrees with getting patient started in the [facility] program.  Feels that patient will be safe at home and that family is supportive.    Discussed safety plan with patient and mother Patient will give any things that can be used to harm himself to his parents (medications, sharps..._ Patient will be in home with his mother, father, and siblings Patient will reach out to someone (family) call mobile crisis, call 911 if start to have suicidal thoughts Patient will follow up with PHP program at [facility]  The suicide prevention education provided includes the following:  Suicide risk factors  Suicide prevention and interventions  National Suicide Hotline telephone number  [facility] assessment telephone number  [facility] Emergency Assistance 911  [facility] and/or Residential Mobile Crisis Unit telephone number   Request made of family/significant other to:  Remove weapons (e.g., guns, rifles, knives), all items previously/currently identified as safety concern.   Remove drugs/medications (over the counter, prescriptions, illicit drugs), all items previously/currently identified as a safety concern.   Past Psychiatric History: See above  Risk to Self:  No Risk to Others:  No Prior Inpatient Therapy:  Yes Prior Outpatient Therapy:  Yes  Past Medical History: History reviewed. No pertinent past medical history. History reviewed. No pertinent surgical history. Family History:  Family History  Problem Relation Age of Onset   Healthy Mother    Healthy Father    Family Psychiatric  History: Denies Social  History:  Social History   Substance and Sexual Activity  Alcohol Use Never     Social History   Substance and Sexual Activity  Drug Use Never    Social History   Socioeconomic History   Marital status: Single    Spouse name: Not on file   Number of children: Not on file   Years of education: Not on file   Highest education level: Not on file  Occupational History   Not on file  Tobacco Use   Smoking status: Never Smoker   Smokeless tobacco: Never Used  Substance and Sexual Activity   Alcohol use: Never   Drug use: Never   Sexual activity: Not on file  Other Topics Concern   Not on file  Social History Narrative   Not on file   Social Determinants of Health   Financial Resource Strain: Not on file  Food Insecurity: Not on file  Transportation Needs: Not on file  Physical Activity: Not on file  Stress: Not on file  Social Connections: Not on file   Additional Social History:    Allergies:  No Known Allergies  Labs:  Results for orders placed or performed during the hospital encounter of 10/31/20 (from the past 48 hour(s))  Comprehensive metabolic panel     Status: None   Collection Time: 10/31/20  3:12 PM  Result Value Ref Range   Sodium 137 135 - 145 mmol/L   Potassium 3.7 3.5 - 5.1 mmol/L   Chloride 101 98 - 111 mmol/L   CO2 26 22 - 32 mmol/L   Glucose, Bld 91 70 - 99 mg/dL    Comment: Glucose reference range applies only to samples taken after fasting for at least 8 hours.   BUN 6 6 - 20 mg/dL   Creatinine, Ser 1.00 0.61 - 1.24 mg/dL   Calcium 9.3 8.9 - 71.2 mg/dL   Total Protein 8.1 6.5 - 8.1 g/dL   Albumin 4.8 3.5 - 5.0 g/dL   AST 19 15 - 41 U/L   ALT 18 0 - 44 U/L   Alkaline Phosphatase 52 38 - 126 U/L   Total Bilirubin 1.2 0.3 - 1.2 mg/dL   GFR, Estimated >19 >75 mL/min    Comment: (NOTE) Calculated using the CKD-EPI Creatinine Equation (2021)    Anion gap 10 5 - 15    Comment: Performed at [facility], [address], [place], [place] [address]  CBC with Differential     Status: None   Collection Time: 10/31/20  3:12 PM  Result Value Ref Range   WBC 6.0 4.0 - 10.5 K/uL   RBC 5.49 4.22 - 5.81 MIL/uL   Hemoglobin 16.2 13.0 - 17.0 g/dL   HCT 49.8 26.4 - 15.8 %   MCV 84.5 80.0 - 100.0 fL   MCH 29.5 26.0 - 34.0 pg   MCHC 34.9 30.0 - 36.0 g/dL   RDW 30.9 40.7 - 68.0 %   Platelets 336 150 - 400 K/uL   nRBC 0.0 0.0 - 0.2 %   Neutrophils Relative % 64 %   Neutro Abs 3.8 1.7 - 7.7 K/uL   Lymphocytes Relative 26 %   Lymphs Abs 1.6 0.7 - 4.0 K/uL   Monocytes Relative 8 %   Monocytes Absolute 0.5 0.1 - 1.0 K/uL   Eosinophils Relative  1 %   Eosinophils Absolute 0.1 0.0 - 0.5 K/uL   Basophils Relative 1 %   Basophils Absolute 0.0 0.0 - 0.1 K/uL   Immature Granulocytes 0 %   Abs Immature Granulocytes 0.01 0.00 - 0.07 K/uL    Comment: Performed at [facility], [address], [place], [place] [address]  Acetaminophen level     Status: Abnormal   Collection Time: 10/31/20  3:12 PM  Result Value Ref Range   Acetaminophen (Tylenol), Serum <10 (L) 10 - 30 ug/mL    Comment: (NOTE) Therapeutic concentrations vary significantly. A range of 10-30 ug/mL  may be an effective concentration for many patients. However, some  are best treated at concentrations outside of this range. Acetaminophen concentrations >150 ug/mL at 4 hours after ingestion  and >50 ug/mL at 12 hours after ingestion are often associated with  toxic reactions.  Performed at [facility], [address], [place], [place] [address]   Salicylate level     Status: Abnormal   Collection Time: 10/31/20  3:12 PM  Result Value Ref Range   Salicylate Lvl <7.0 (L) 7.0 - 30.0 mg/dL    Comment: Performed at [facility], [address], [place], [place] [address]  Ethanol     Status: None   Collection Time: 10/31/20  3:12 PM  Result Value Ref Range   Alcohol, Ethyl (B) <10 <10  mg/dL    Comment: (NOTE) Lowest detectable limit for serum alcohol is 10 mg/dL.  For medical purposes only. Performed at [facility], [address] [place], [place] [address]   Urine rapid drug screen (hosp performed)     Status: None   Collection Time: 10/31/20  3:53 PM  Result Value Ref Range   Opiates NONE DETECTED NONE DETECTED   Cocaine NONE DETECTED NONE DETECTED   Benzodiazepines NONE DETECTED NONE DETECTED   Amphetamines NONE DETECTED NONE DETECTED   Tetrahydrocannabinol NONE DETECTED NONE DETECTED   Barbiturates NONE DETECTED NONE DETECTED    Comment: (NOTE) DRUG SCREEN FOR MEDICAL PURPOSES ONLY.  IF CONFIRMATION IS NEEDED FOR ANY PURPOSE, NOTIFY LAB WITHIN 5 DAYS.  LOWEST DETECTABLE LIMITS FOR URINE DRUG SCREEN Drug Class                     Cutoff (ng/mL) Amphetamine and metabolites    1000 Barbiturate and metabolites    200 Benzodiazepine                 200 Tricyclics and metabolites     300 Opiates and metabolites        300 Cocaine and metabolites        300 THC                            50 Performed at [facility], [address], [place], [place] [address]     Current Facility-Administered Medications  Medication Dose Route Frequency Provider Last Rate Last Admin   buPROPion (WELLBUTRIN XL) 24 hr tablet 150 mg  150 mg Oral Daily Couture, Cortni S, PA-C   150 mg at 11/02/20 0945   hydrOXYzine (ATARAX/VISTARIL) tablet 25 mg  25 mg Oral TID PRN Couture, Cortni S, PA-C   25 mg at 11/01/20 6962   traZODone (DESYREL) tablet 50 mg  50 mg Oral QHS PRN Couture, Cortni S, PA-C       Current Outpatient Medications  Medication Sig Dispense Refill   acetaminophen (TYLENOL) 325 MG tablet Take 650 mg by mouth every 6 (six) hours as needed for moderate pain.     buPROPion (WELLBUTRIN XL) 150 MG 24 hr tablet Take 1 tablet (150 mg total) by mouth daily. 30 tablet 2   hydrOXYzine (ATARAX/VISTARIL) 25 MG tablet Take 1  tablet (25 mg total) by mouth 3 (three) times daily as needed for anxiety. 90 tablet 1   traZODone (DESYREL) 50 MG tablet Take 1 tablet (50 mg total) by mouth at bedtime as needed for sleep. 30 tablet 2    Musculoskeletal: Strength & Muscle Tone: within normal limits Gait & Station: normal Patient leans: N/A  Psychiatric Specialty Exam: Physical Exam Vitals and nursing note reviewed. Chaperone present: Sitter at bedside.  Constitutional:      General: He is not in acute distress.    Appearance: Normal appearance. He is not ill-appearing.  HENT:     Head: Normocephalic.  Eyes:     Pupils: Pupils are equal, round, and reactive to light.  Cardiovascular:     Rate and Rhythm: Normal rate.  Pulmonary:     Effort: Pulmonary effort is normal.  Musculoskeletal:        General: Normal range of motion.     Cervical back: Normal range of motion.  Skin:    General: Skin is warm and dry.  Neurological:     Mental Status: He is alert and oriented to person, place, and time.  Psychiatric:        Attention and Perception: Attention and perception normal. He does not perceive auditory or visual hallucinations.        Mood and Affect: Affect normal. Mood is depressed.        Speech: Speech normal.        Behavior: Behavior normal. Behavior is cooperative.        Thought Content: Thought content normal. Thought content is not paranoid or delusional. Thought content does not include homicidal or suicidal ideation.        Cognition and Memory: Cognition and memory normal.        Judgment: Judgment is impulsive.     Review of Systems  Constitutional: Negative.   HENT: Negative.   Eyes: Negative.   Respiratory: Negative.   Cardiovascular: Negative.   Gastrointestinal: Negative.  Constipation: Denies at this time and is able to contract for safety.  Genitourinary: Negative.   Musculoskeletal: Negative.   Skin: Negative.   Neurological: Negative.   Hematological: Negative.    Psychiatric/Behavioral: Negative for agitation, behavioral problems, confusion and hallucinations. Self-injury: Denies. Sleep disturbance: Denies. Suicidal ideas: Denies at this time and is able to contract for safety. Nervous/anxious: Stable.        Patient states yesterday she was feeling overwhelmed when he took to much of his medications in suicide attempt.    Reports he is feeling better; no longer suicidal and is able to contract for safety    Blood pressure 124/76, pulse 92, temperature 97.8 F (36.6 C), temperature source Oral, resp. rate 16, SpO2 97 %.There is no height or weight on file to calculate BMI.  General Appearance: Casual  Eye Contact:  Good  Speech:  Clear and Coherent and Normal Rate  Volume:  Normal  Mood:  Depressed  Affect:  Congruent  Thought Process:  Coherent, Goal Directed and Descriptions of Associations: Intact  Orientation:  Full (Time, Place, and Person)  Thought Content:  WDL  Suicidal Thoughts:  No.  Denies at this time and is able to contract for safety.  Patietn states he will reach out if begin to feel suicidal  Homicidal Thoughts:  No  Memory:  Immediate;   Good Recent;   Good  Judgement:  Intact  Insight:  Present  Psychomotor Activity:  Normal  Concentration:  Concentration: Good and Attention Span: Good  Recall:  Good  Fund of Knowledge:  Good  Language:  Good  Akathisia:  No  Handed:  Right  AIMS (if indicated):     Assets:  Communication Skills Desire for Improvement Financial Resources/Insurance Housing Physical Health Resilience Social Support  ADL's:  Intact  Cognition:  WNL  Sleep:      Treatment Plan Summary: Plan Patient psychiatrically cleared to start PHP program  Disposition:  No evidence of imminent risk to self or others at present.   Supportive therapy provided about ongoing stressors. Refer to IOP. Discussed crisis plan, support from social network, calling 911, coming to the Emergency Department, and calling  Suicide Hotline.    Discharge Instructions     For your behavioral health needs, you are advised to follow up with the Partial Hospitalization Program (PHP) at the [facility] [place].  This program meets Monday - Friday from 9:00 am - 1:00 pm.  Due to Covid-19, this program is currently virtual.  Baldo Daub, LCSW, who runs the program, will be reaching out to you to schedule your admission into the program.  If you have any questions, contact her at the phone number indicated below:       [facility]      [address], [place] [address]      Contact person: Baldo Daub, LCSW      [phone]        Assunta Found, NP 11/02/2020 12:55 PM

## 2020-11-03 ENCOUNTER — Telehealth (HOSPITAL_COMMUNITY): Payer: Self-pay | Admitting: Licensed Clinical Social Worker

## 2020-11-04 ENCOUNTER — Encounter (HOSPITAL_COMMUNITY): Payer: Self-pay

## 2020-11-04 ENCOUNTER — Ambulatory Visit (INDEPENDENT_AMBULATORY_CARE_PROVIDER_SITE_OTHER): Payer: No Payment, Other | Admitting: Licensed Clinical Social Worker

## 2020-11-04 DIAGNOSIS — F332 Major depressive disorder, recurrent severe without psychotic features: Secondary | ICD-10-CM | POA: Diagnosis not present

## 2020-11-04 NOTE — Psych (Addendum)
[facility] PHP THERAPIST PROGRESS NOTE  Riley Smith [id number]  Session Time: 9:00am - Check In   Participation Level: Active  Behavioral Response: DisheveledAlertNA  Type of Therapy: Group Therapy  Treatment Goals addressed: Anxiety and Coping  Interventions: CBT, Solution Focused, Strength-based and Supportive  Summary: Riley Smith is a 20 y.o. male who presents with Severe episode of recurrent major depressive disorder, without psychotic features.   Suicidal/Homicidal: No  Therapist Response: Duane reported feeling "alright". He reported his depression at a 3 or 4 and his anxiety at a 2.  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 Severe episode of recurrent major depressive disorder, without psychotic features (HCC)        Session Time: 10:00am - What is Depression?   Participation Level: Active  Behavioral Response: DisheveledAlertNA  Type of Therapy: Group Therapy  Treatment Goals addressed: Anxiety and Coping  Interventions: CBT, Solution Focused, Strength-based and Supportive  Therapist Response: Riley Smith shared "I struggle with being very lonely and sad. I also feel like I am stuck because I can't get myself to do anything. I have a lack of motivation to do things because of the lack of energy. Some of my actions when I'm depressed is I lay around the house all day, I may attempt self-harming, isolating myself from loved ones because you feel like they do not care".   "I think I have always struggled with depression, but when the incident with my ex-girlfriend happened, everything came to a boiling point"  Riley Smith identified the following coping skills he utilizes currently: playing video games, watching movies, listening to music. Riley Smith reports that he hopes to learn more  coping mechanisms for depression while participating in this program.      Session Time: 11:00am - Cognitive Behavioral Therapy (CBT)  Participation Level: Active  Behavioral Response: DisheveledAlertNA  Type of Therapy: Group Therapy  Treatment Goals addressed: Anxiety and Coping  Interventions: CBT, Solution Focused, Strength-based and Supportive  Therapist Response: Riley Smith reports he has been exposed to CBT in the past.  He shared that he struggles  With coping with how he feels because he was not taught healthy coping skills and ways of thinking. Riley Smith shared that one of his cognitive distortions is catastrophizing. He shared that it is a serious issue for him. He reports that he believes the structure of CBT will help him overcome this due to the focus being on changing the way you think and feel about stressful situations.   Discussed strategies: Addressing personal cognitive distortions, gaining better understanding the behaviors and motivation of others, using problem solving skills to cope with difficult situations and learning to develop a greater sense of confidence in one's own abilities.     Session Time: 12:00pm - CBT Technique: Behavioral Activation   Participation Level: Active  Behavioral Response: DisheveledAlertNA  Type of Therapy: Group Therapy  Treatment Goals addressed: Anxiety and Coping  Interventions: CBT, Solution Focused, Strength-based and Supportive  Therapist Response: Riley Smith shared his opinion that positive reinforcement is pushing more ways to think positive.  He reports that hanging with his friends and family is a form of positive reinforcement for him. He also stated that having a schedule/structure is helpful with addressing depression. Riley Smith identified negative behavior responses he has after experiencing  depression and came up with alternative positive behavioral responses to utilize in the future : Negative behaviors - Isolation, lack of  motivation. Positive behaviors - becoming productive and leaning on loved ones more.      Session Time: 12:45pm-1:00pm - Wrap Up   Participation Level: Active  Behavioral Response: DisheveledAlertNA  Type of Therapy: Group Therapy  Treatment Goals addressed: Anxiety and Coping  Interventions: CBT, Solution Focused, Strength-based and Supportive  Therapist Response: Reports group was good. I feeling pretty good, I think I have a better understanding of some things regarding my depression that I did not know before. Riley Smith rated his depression at a 1 and his anxiety at 1. Denies SI, HI and AVH.        [facility]-PHP THERAPIST 11/04/2020

## 2020-11-04 NOTE — Progress Notes (Signed)
Virtual Visit via Video Note  I connected with Riley Smith on 11/04/20 at  9:00 AM EST by a video enabled telemedicine application and verified that I am speaking with the correct person using two identifiers.  Location: Patient: Home Provider: Home office   I discussed the limitations of evaluation and management by telemedicine and the availability of in person appointments. The patient expressed understanding and agreed to proceed.    I discussed the assessment and treatment plan with the patient. The patient was provided an opportunity to ask questions and all were answered. The patient agreed with the plan and demonstrated an understanding of the instructions.   The patient was advised to call back or seek an in-person evaluation if the symptoms worsen or if the condition fails to improve as anticipated.  I provided 15 minutes of non-face-to-face time during this encounter.   Oneta Rack, NP    Psychiatric Initial Adult Assessment   Patient Identification: Riley Smith MRN:  [id number] Date of Evaluation:  11/04/2020 Referral Source: [facility] Chief Complaint:   Visit Diagnosis:    ICD-10-CM   1. Severe episode of recurrent major depressive disorder, without psychotic features (HCC)  F33.2     History of Present Illness:  Riley Smith presents with worsening depression and anxiety. Reports he was recently discharged from inpatient admission due to a suicide attempt. Reports last attempt was 2 weeks ago with a plan to overdose. Patient reports a verbal altercation between him and his girlfriend which caused his depression to worsen. While inpatient patient was initiated on Wellbutrin, hydroxyzine, trazodone. He reports taking and tolerating medications well. States he is currently employed at the [employer]. Patient validates below information. Patient to start [facility] partial hospitalization program on 2/23/ 2022  Per admission assessment note:Riley Smith  is a 20 y.o. male who presents to [facility] as walk-in for assessment of worsening depression and suicidal thoughts following incident with ex-girlfriend. Patient is calm and cooperative; actively engaging in assessment. Patient reports worsening depression with SI, and he admits to overdose attempt and cutting last night.  When he woke up, he considered stabbing himself in the throat when he realized he didn't die from the overdose.  Noted on HPI on 11/02/2020-Riley Smith reports he was feeling overwhelmed yesterday and depressed with he took an overdose in a suicide attempt.  Patient states he is feeling better today.  Patient states feeling better and no suicidal thoughts now.  Patient states that he has outpatient psychiatric services with [facility] for therapy and medication management but unsure when his next appointment is.  Patient states he lives with his parents and siblings and that he is also employed.  Associated Signs/Symptoms: Depression Symptoms:  depressed mood, feelings of worthlessness/guilt, difficulty concentrating, (Hypo) Manic Symptoms:  Distractibility, Impulsivity, Anxiety Symptoms:  Excessive Worry, Psychotic Symptoms:  Hallucinations: None PTSD Symptoms: Avoidance:  Decreased Interest/Participation  Past Psychiatric History:   Previous Psychotropic Medications: No   Substance Abuse History in the last 12 months:  No.  Consequences of Substance Abuse: NA  Past Medical History:  Past Medical History:  Diagnosis Date  . Anxiety   . Depression    History reviewed. No pertinent surgical history.  Family Psychiatric History:      Family History:  Family History  Problem Relation Age of Onset  . Healthy Mother   . Depression Mother   . Healthy Father     Social History:   Social History  Socioeconomic History  . Marital status: Single    Spouse name: Not on file  . Number of children: 0  . Years of education: Not on file  . Highest education level:  High school graduate  Occupational History  . Not on file  Tobacco Use  . Smoking status: Never Smoker  . Smokeless tobacco: Never Used  Vaping Use  . Vaping Use: Never used  Substance and Sexual Activity  . Alcohol use: Never  . Drug use: Never  . Sexual activity: Not on file  Other Topics Concern  . Not on file  Social History Narrative  . Not on file   Social Determinants of Health   Financial Resource Strain: Not on file  Food Insecurity: Not on file  Transportation Needs: Not on file  Physical Activity: Not on file  Stress: Not on file  Social Connections: Not on file    Additional Social History:    Allergies:  No Known Allergies  Metabolic Disorder Labs: Lab Results  Component Value Date   HGBA1C 5.2 07/02/2020   MPG 103 07/02/2020   No results found for: PROLACTIN Lab Results  Component Value Date   CHOL 184 07/02/2020   TRIG 207 (H) 07/02/2020   HDL 44 07/02/2020   CHOLHDL 4.2 07/02/2020   VLDL 41 (H) 07/02/2020   LDLCALC 99 07/02/2020   Lab Results  Component Value Date   TSH 4.570 (H) 07/02/2020    Therapeutic Level Labs: No results found for: LITHIUM No results found for: CBMZ No results found for: VALPROATE  Current Medications: Current Outpatient Medications  Medication Sig Dispense Refill  . acetaminophen (TYLENOL) 325 MG tablet Take 650 mg by mouth every 6 (six) hours as needed for moderate pain.    Marland Kitchen buPROPion (WELLBUTRIN XL) 150 MG 24 hr tablet Take 1 tablet (150 mg total) by mouth daily. 30 tablet 2  . hydrOXYzine (ATARAX/VISTARIL) 25 MG tablet Take 1 tablet (25 mg total) by mouth 3 (three) times daily as needed for anxiety. 90 tablet 1  . traZODone (DESYREL) 50 MG tablet Take 1 tablet (50 mg total) by mouth at bedtime as needed for sleep. 30 tablet 2   No current facility-administered medications for this visit.    Musculoskeletal: Strength & Muscle Tone: within normal limits Gait & Station: normal Patient leans:  N/A  Psychiatric Specialty Exam: Review of Systems  There were no vitals taken for this visit.There is no height or weight on file to calculate BMI.  General Appearance: Casual  Eye Contact:  Good  Speech:  Clear and Coherent  Volume:  Normal  Mood:  Anxious and Depressed  Affect:  Congruent  Thought Process:  Coherent  Orientation:  Full (Time, Place, and Person)  Thought Content:  Logical  Suicidal Thoughts:  No  Homicidal Thoughts:  No  Memory:  Immediate;   Fair Recent;   Fair  Judgement:  Fair  Insight:  Fair  Psychomotor Activity:  Normal  Concentration:  Concentration: Fair  Recall:  [employer] of Knowledge:Fair  Language: Fair  Akathisia:  No  Handed:  Right  AIMS (if indicated):  done  Assets:  Communication Skills Desire for Improvement Resilience Social Support  ADL's:  Intact  Cognition: WNL  Sleep:  Good   Screenings: AIMS   Flowsheet Row [facility]  AIMS Total Score 0    AUDIT   Flowsheet Row [facility]  Alcohol Use Disorder Identification Test Final Score (AUDIT) 2    PHQ2-9   Flowsheet Row Counselor from 11/04/2020 in [facility]  PHQ-2 Total Score 5  PHQ-9 Total Score 22    Flowsheet Row Counselor from 11/04/2020 in [facility] ED from 10/31/2020 in [facility] from 10/26/2020 in [facility] EMERGENCY DEPARTMENT  C-SSRS RISK CATEGORY Error: Q3, 4, or 5 should not be populated when Q2 is No No Risk No Risk      Assessment and Plan:  Patient to start partial hospitalization programming Continue medications as directed  Treatment plan was reviewed and agreed upon by NP T. Melvyn Neth and patient Riley Smith need for group services    Oneta Rack, NP 2/23/20229:49 PM

## 2020-11-04 NOTE — Progress Notes (Signed)
Spoke with patient via Webex video call, used 2 identifiers to correctly identify patient. States he was referred by the ER after an OD on his medications. He stated a few days in the ER since he had COVID as well. He had an argument with his girlfriend the day of the OD and said things he regrets. His stressors are the ex-girlfriend, work, and living with his mother. On scale 1-10 as 10 being worst he rates depression at 3 and anxiety at 2. Denies SI/HI or AV hallucinations. PHQ9=22. No side effects from medications. No issues or complaints.

## 2020-11-05 ENCOUNTER — Ambulatory Visit (INDEPENDENT_AMBULATORY_CARE_PROVIDER_SITE_OTHER): Payer: No Payment, Other | Admitting: Licensed Clinical Social Worker

## 2020-11-05 DIAGNOSIS — F332 Major depressive disorder, recurrent severe without psychotic features: Secondary | ICD-10-CM

## 2020-11-05 NOTE — Psych (Signed)
[facility] BH PHP THERAPIST PROGRESS NOTE  DAMAR PETIT [id number]  Session Time: 9:00am - Check In   Participation Level: Active  Behavioral Response: DisheveledAlertNA  Type of Therapy: Group Therapy  Treatment Goals addressed: Anxiety and Coping  Interventions: CBT, Solution Focused, Strength-based and Supportive  Summary: Riley Smith is a 20 y.o. male who presents with Severe episode of recurrent major depressive disorder, without psychotic features.   Suicidal/Homicidal: No  Therapist Response: Vicki was late to group. He reports he overslept. Riley Smith shared that he relaxed and played his video game majority of day. Riley Smith rated his depression at a 1 and his anxiety at a 1. Denies SI, HI, AVH   Diagnosis: Severe episode of recurrent major depressive disorder, without psychotic features (HCC) [F33.2]    1. Severe episode of recurrent major depressive disorder, without psychotic features (HCC)        Session Time: 10:00am Stigmas Surrounding Mental Health   Participation Level: Active  Behavioral Response: DisheveledAlertNA  Type of Therapy: Group Therapy  Treatment Goals addressed: Anxiety and Coping  Interventions: CBT, Solution Focused, Strength-based and Supportive  Therapist Response: Reports its different. He reports when something is physically wrong with you, they seem to be more "thorough". He states with mental health it depends on what you came in for with mental health.   Personal Experiences - I've heard a lot of the "you're crazy", "you're not strong enough". He states that it made him feel bad and that it was not helpful. HE states that it's frustrating because you don't choose to be "depressed" and people do not understand. States that society influences the negative perspective on mental health.        Session Time: 11:00am - Radical Acceptance   Participation Level: Active  Behavioral Response: DisheveledAlertNA  Type of Therapy: Group  Therapy  Treatment Goals addressed: Anxiety and Coping  Interventions: CBT, Solution Focused, Strength-based and Supportive  Therapist Response: Reports radical acceptance is coming to terms with the reality that there is nothing you can do. Shared the situation with his ex-girlfriend. He reports she does not want  to communicate with him at all. He states that it bothers him, and it led to an increase of depressive symptoms because it was "out of my control" and it is something I had to just deal with.       Session Time: 12:00pm Effective Communication   Participation Level: Active  Behavioral Response: DisheveledAlertNA  Type of Therapy: Group Therapy  Treatment Goals addressed: Anxiety and Coping  Interventions: CBT, Solution Focused, Strength-based and Supportive  Therapist Response: "I can be a good a communicator at times". Sometimes I struggle communicating my emotions and I also struggle with listening to what other people tell me. Sometimes, I hear what they are saying but I don't do active listening well".   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 Effective communication skills reviewed: Active listening, Passive Aggressive and Assertive Communication, "I" Statements, Reflections and Soft Start ups.     Session Time: 12:45pm-1:00pm - Wrap Up   Participation Level: Active  Behavioral Response: DisheveledAlertNA  Type of Therapy: Group Therapy  Treatment Goals addressed: Anxiety and Coping  Interventions: CBT, Solution Focused, Strength-based and Supportive  Therapist Response: Feels good. No plans this afternoon. Depression at a 1 and his anxiety at a 1. Riley Smith reports he plans to return to group in  the morning.      [facility]-PHP THERAPIST 11/05/2020

## 2020-11-06 ENCOUNTER — Ambulatory Visit (INDEPENDENT_AMBULATORY_CARE_PROVIDER_SITE_OTHER): Payer: No Payment, Other | Admitting: Licensed Clinical Social Worker

## 2020-11-06 ENCOUNTER — Other Ambulatory Visit: Payer: Self-pay

## 2020-11-06 DIAGNOSIS — F332 Major depressive disorder, recurrent severe without psychotic features: Secondary | ICD-10-CM | POA: Diagnosis not present

## 2020-11-06 NOTE — Psych (Signed)
[facility] BH PHP THERAPIST PROGRESS NOTE  APOLLO TIMOTHY [id number]  Session Time: 9:00am - Check In   Participation Level: Active  Behavioral Response: DisheveledAlertNA  Type of Therapy: Group Therapy  Treatment Goals addressed: Anxiety and Coping  Interventions: CBT, Solution Focused, Strength-based and Supportive  Summary: Riley Smith is a 20 y.o. male who presents with Severe episode of recurrent major depressive disorder, without psychotic features.   Suicidal/Homicidal: No  Therapist Response: Reports feeling "okay". Reports he does not have any plans this weekend, however he states he may contact his father to see if they can spend some quality time. Riley Smith rated his depression at a 4. He shared he had a "Weird" dream about his ex-girlfriend, and it made him "a little sad" this morning. He rated his anxiety at a 2 this morning. Denies any SI, HI or AVH.   Diagnosis: No primary diagnosis found.    No diagnosis found.     Session Time: 10:00am - Self Esteem  Participation Level: Active  Behavioral Response: DisheveledAlertNA  Type of Therapy: Group Therapy  Treatment Goals addressed: Anxiety and Coping  Interventions: CBT, Solution Focused, Strength-based and Supportive  Therapist Response: Self Esteem is thinking of yourself as worthy and loved. Reports he considered his self-esteem as low or medium, because he often doubts himself and his abilities. "I often think I am not good enough".   Factors that impact his self-esteem is: what other people think, myself image, "I always felt I was a little overweight". He also shared those relationships in your life effect your self-esteem as well.   Maslow's Hierarchy - Feels he is currently in the Safety section of Maslow's Hierarchy. Reports he struggles accepting his body (overweight) and his employment and property.    I am not sure what my values and morals are, and I feel that's the reason you struggle maintain a  healthy level of self-esteem. Riley Smith shared the following strengths he has: "I'm kind and caring and I can be very motivated at times". Riley Smith completed a worksheet processing his strengths and qualities.   If I am able to improve my self-esteem, I may be able to maintain healthier relationships in the future. If you are not confident in yourself, how can someone else have it. Riley Smith shared that his lack of self-esteem caused his relationship with his ex-girlfriend to become strained. He reports he felt that she did not care at times and that led him to say that they mind as well not be in a relationship.      Session Time: 11:00am - Self Care  Participation Level: Active  Behavioral Response: DisheveledAlertNA  Type of Therapy: Group Therapy  Treatment Goals addressed: Anxiety and Coping  Interventions: CBT, Solution Focused, Strength-based and Supportive  Therapist Response: I try to do my daily routines, making sure I eat, drink and take my medications. "I would like to get a haircut or a trim". I enjoy playing video games and artwork.  SELF CARE GOAL - I would like to meditate more, at least 10 minutes a day.  Reviewed self-care tips and reports she plans to implement in the future.      Session Time: 12:00pm -  Gratitude and Self Compassion  Participation Level: Active  Behavioral Response: DisheveledAlertNA  Type of Therapy: Group Therapy  Treatment Goals addressed: Anxiety and Coping  Interventions: CBT, Solution Focused, Strength-based and Supportive  Therapist Response: Reports self-compassion is when you care about yourself and regarding mental health caring  about yourself is vital for staying in a good space. Riley Smith reports he struggles with being too critical of himself at times. Riley Smith reviewed the following tips regarding self-compassion:   Having A Fair Attitude Toward Yourself Accept Yourself for Who You Are Take Care of Yourself Accept that Struggle is  Normal Practice Mindful Awareness     Session Time: 12:45pm-1:00pm - Wrap Up   Participation Level: Active  Behavioral Response: DisheveledAlertNA  Type of Therapy: Group Therapy  Treatment Goals addressed: Anxiety and Coping  Interventions: CBT, Solution Focused, Strength-based and Supportive  Therapist Response: "I feel decent". Depression at a 2 and his anxiety at a 2. Denies SI, HI, AVH. Reports he will be in attendance for group on Monday.      [facility]-PHP THERAPIST 11/06/2020

## 2020-11-09 ENCOUNTER — Ambulatory Visit (HOSPITAL_COMMUNITY): Payer: No Payment, Other

## 2020-11-09 ENCOUNTER — Telehealth (HOSPITAL_COMMUNITY): Payer: Self-pay | Admitting: Licensed Clinical Social Worker

## 2020-11-09 NOTE — Progress Notes (Signed)
Patient did not show up for group this morning. CSW contacted patient to see why he was absent. There has been no response as of now.    CSW will continue to follow.     Baldo Daub, MSW, LCSW Clinical Child psychotherapist (Facility Based Crisis) [facility]

## 2020-11-10 ENCOUNTER — Ambulatory Visit (INDEPENDENT_AMBULATORY_CARE_PROVIDER_SITE_OTHER): Payer: No Payment, Other | Admitting: Clinical

## 2020-11-10 ENCOUNTER — Telehealth (HOSPITAL_COMMUNITY): Payer: Self-pay | Admitting: Licensed Clinical Social Worker

## 2020-11-10 ENCOUNTER — Other Ambulatory Visit: Payer: Self-pay

## 2020-11-10 ENCOUNTER — Ambulatory Visit (HOSPITAL_COMMUNITY): Payer: Self-pay | Admitting: Licensed Clinical Social Worker

## 2020-11-10 DIAGNOSIS — F332 Major depressive disorder, recurrent severe without psychotic features: Secondary | ICD-10-CM | POA: Diagnosis not present

## 2020-11-10 NOTE — Progress Notes (Signed)
   THERAPIST PROGRESS NOTE Virtual Visit via Video Note  I connected with [name] on 11/10/20 at  4:00 PM EST by a video enabled telemedicine application and verified that I am speaking with the correct person using two identifiers.  Location: Patient: home Provider: office   I discussed the limitations of evaluation and management by telemedicine and the availability of in person appointments. The patient expressed understanding and agreed to proceed.   Follow Up Instructions: I discussed the assessment and treatment plan with the patient. The patient was provided an opportunity to ask questions and all were answered. The patient agreed with the plan and demonstrated an understanding of the instructions.   The patient was advised to call back or seek an in-person evaluation if the symptoms worsen or if the condition fails to improve as anticipated.   Session Time: 20 minutes  Participation Level: Active  Behavioral Response: CasualAlertEuthymic  Type of Therapy: Individual Therapy  Treatment Goals addressed: Diagnosis: depression  Interventions: CBT  Summary:  Riley Smith is a 20 y.o. male who presents for the scheduled session oriented times five, appropriately dressed, and friendly. Client denied hallucinations and delusions. Client reported on today doing fairly well. Client reported since the last session he was treated at behavioral health for suicidal ideation by intentional over dose. Client reported he was triggered by his ex girlfriend whom he has had a conflictual relationship with since last year. Client reported she triggered him by not making it clear about what they mean to each other. Client reported since he was discharged he has not spoken to her but needs to get in contact with her to gather some of his belongings which has been a challenge. Client reported she also has some mental health issues that she has expressed she needs to work on. Client reported  he is having a hard time telling what's best for him saying "I can't be friends with her and I can't be nothing to her" because he has intimate feelings about her. Client reported he was enrolled into the [facility] program but has been missing appointments because he either wakes up right at the time or over sleeps.    Suicidal/Homicidal: Nowithout intent/plan  Therapist Response:  Therapist began the session by asking the client how he has been doing since the last appointment. Therapist actively listened to the clients thoughts and feelings. Therapist asked the client open ended questions about stressors that led to his recent hospitalization. Therapist used CBT to discuss identifying his triggers to help him create boundaries to prevent and/ or minimize chance for relapse. Therapist reinforced to the client that he needs to wake up early to attend the partial hospitalization program. Client was scheduled for a future therapy appointment after completing the program.     Plan: Return again in 4 weeks for individual therapy after completing the PHP program.  Diagnosis: Severe episode of recurrent major depressive disorder, without psychotic features  Neena Rhymes Coltin Casher, LCSW 11/10/2020

## 2020-11-10 NOTE — Progress Notes (Signed)
CSW contacted the patient to determine the reasoning for his absences from group yesterday, 11/09/20 and today, 11/10/20. Patient reports that he struggles waking up on time for the group session. Jaycee shared that he will "try" to get up tomorrow morning for group.    CSW informed Tyee that if he missed group for the 3rd time, he will be discharged from the [facility] and will be referred to an individual therapist who may be a better fit for his schedule. Leon voiced understanding and  stated that he would try to attend tomorrow's session.    CSW will continue to follow.       Baldo Daub, MSW, LCSW Clinical Child psychotherapist (Facility Based Crisis) [facility]

## 2020-11-11 ENCOUNTER — Ambulatory Visit (HOSPITAL_COMMUNITY): Payer: Self-pay

## 2020-11-12 ENCOUNTER — Ambulatory Visit (HOSPITAL_COMMUNITY): Payer: Self-pay

## 2020-11-13 ENCOUNTER — Encounter (HOSPITAL_COMMUNITY): Payer: Self-pay | Admitting: Licensed Clinical Social Worker

## 2020-11-13 ENCOUNTER — Ambulatory Visit (HOSPITAL_COMMUNITY): Payer: Self-pay

## 2020-11-13 NOTE — Progress Notes (Signed)
  [facility] partial outpatient Program Discharge Summary  Riley Smith [id number]  Admission date: 11/04/2020 Discharge date: November 10, 2020  Reason for admission: Per admission assessment noteLDaniel Smith presents with worsening depression and anxiety. Reports he was recently discharged from inpatient admission due to a suicide attempt. Reports last attempt was 2 weeks ago with a plan to overdose. Patient reports a verbal altercation between him and his girlfriend which caused his depression to worsen. While inpatient patient was initiated on Wellbutrin, hydroxyzine, trazodone. He reports taking and tolerating medications well. States he is currently employed at the [employer]. Patient validates below information. Patient to start [facility] partial hospitalization program on 2/23/ 2022   Progress in Program Toward Treatment Goals: Ongoing, patient attended and participated sporadically.  Multiple attempts was made by therapist to follow-up with patient.  NP did not interview patient at discharge.  Patient to keep all follow-up appointments with outpatient providers.  Progress (rationale): [facility] behavioral health urgent care facility walk-in [facility]  Take all medications as prescribed. Keep all follow-up appointments as scheduled.  Do not consume alcohol or use illegal drugs while on prescription medications. Report any adverse effects from your medications to your primary care provider promptly.  In the event of recurrent symptoms or worsening symptoms, call 911, a crisis hotline, or go to the nearest emergency department for evaluation.    T.Mariangela Heldt nurse practitioner 11/13/2020

## 2020-11-16 ENCOUNTER — Ambulatory Visit (HOSPITAL_COMMUNITY): Payer: Self-pay

## 2020-11-16 MED FILL — traZODone HCL 50 MG TABS: 50 | 30 days supply | Qty: 30 | Fill #1

## 2020-11-16 MED FILL — hydrOXYzine HCL 25 MG TABS: 25 | 30 days supply | Qty: 90 | Fill #0

## 2020-11-16 MED FILL — BUPROPION HCL XL 150 MG TAB: 150 | 30 days supply | Qty: 30 | Fill #0

## 2020-11-17 ENCOUNTER — Ambulatory Visit (HOSPITAL_COMMUNITY): Payer: Self-pay

## 2020-12-02 ENCOUNTER — Other Ambulatory Visit (HOSPITAL_COMMUNITY): Payer: Self-pay | Admitting: Physician Assistant

## 2020-12-02 ENCOUNTER — Telehealth (INDEPENDENT_AMBULATORY_CARE_PROVIDER_SITE_OTHER): Payer: No Payment, Other | Admitting: Physician Assistant

## 2020-12-02 ENCOUNTER — Other Ambulatory Visit: Payer: Self-pay

## 2020-12-02 ENCOUNTER — Encounter (HOSPITAL_COMMUNITY): Payer: Self-pay | Admitting: Physician Assistant

## 2020-12-02 DIAGNOSIS — F331 Major depressive disorder, recurrent, moderate: Secondary | ICD-10-CM

## 2020-12-02 DIAGNOSIS — G4700 Insomnia, unspecified: Secondary | ICD-10-CM

## 2020-12-02 DIAGNOSIS — F411 Generalized anxiety disorder: Secondary | ICD-10-CM

## 2020-12-02 MED ORDER — BUPROPION HCL ER (XL) 150 MG PO TB24
150.0000 mg | ORAL_TABLET | Freq: Every day | ORAL | 2 refills | Status: DC
Start: 2020-12-02 — End: 2020-12-02

## 2020-12-02 MED ORDER — TRAZODONE HCL 50 MG PO TABS: 50.0000 mg | ORAL_TABLET | Freq: Every evening | ORAL | 2 refills | Status: DC | PRN

## 2020-12-02 MED ORDER — HYDROXYZINE HCL 25 MG PO TABS: 25.0000 mg | ORAL_TABLET | Freq: Three times a day (TID) | ORAL | 1 refills | Status: DC | PRN

## 2020-12-02 NOTE — Progress Notes (Signed)
BH MD/PA/NP OP Progress Note  Virtual Visit via Telephone Note  I connected with Riley Smith on 12/02/20 at  4:30 PM EDT by telephone and verified that I am speaking with the correct person using two identifiers.  Location: Patient: Home Provider: Clinic   I discussed the limitations, risks, security and privacy concerns of performing an evaluation and management service by telephone and the availability of in person appointments. I also discussed with the patient that there may be a patient responsible charge related to this service. The patient expressed understanding and agreed to proceed.  Follow Up Instructions:   I discussed the assessment and treatment plan with the patient. The patient was provided an opportunity to ask questions and all were answered. The patient agreed with the plan and demonstrated an understanding of the instructions.   The patient was advised to call back or seek an in-person evaluation if the symptoms worsen or if the condition fails to improve as anticipated.  I provided 21 minutes of non-face-to-face time during this encounter.  Meta Hatchet, PA   12/02/2020 5:01 PM Riley Smith  MRN:  [id number]  Chief Complaint: Follow up and medication management  HPI:   Riley Smith is a 20 year old with a past psychiatric history significant for major depressive disorder, generalized anxiety disorder, and insomnia who presents to [facility] for follow-up and medication management.  Patient is currently being managed on the following medications:  Bupropion XL 150 mg 24-hour tablet daily Hydroxyzine 25 mg 3 times daily as needed Trazodone 50 mg at bedtime  Patient reports no issues or concerns with his current medication regimen.  Patient denies the need for dosage adjustments at this time and is requesting refills on all his medications following the conclusion of the encounter.  Patient reports that he has  been doing a lot better in regards to his mood since the last encounter.  Patient does express that he had a suicide attempt a couple months ago.  Patient attributed his suicide attempt to ongoing issues with his former girlfriend.  Patient does not feel like he is a danger to himself and is able to contract for his safety at this time.  A PHQ 9 screen was performed with the patient scoring a 12.  A GAD-7 screen was also performed with the patient scoring day 12.    Patient expresses that he has also been experiencing some nausea and vomiting due to his history of gastroenteritis.  Patient is interested in receiving medication to help manage his nausea and vomiting.  Patient expresses that hydroxyzine has been somewhat helpful with his nausea but states that his symptoms are still a problem.  Patient was supposed to have an appointment scheduled for 12/21/2020 with his primary care provider in reference to his gastroenteritis, but his appointment was recently canceled.  Patient was informed to reach out to his primary care provider to reschedule his appointment.  Patient was advised to reach out to psychiatric provider if his appointment was not rescheduled and he needed help.  Patient is pleasant, calm, cooperative, and fully engaged in conversation during the encounter.  Patient reports that his mood has been pretty decent as of late.  A [place] Suicide Severity Rating Scale was performed with the patient considered high risk.  Patient denies active suicidal or homicidal ideations. He further denies auditory or visual hallucinations and does not appear to be responding to auditory or visual hallucinations. Patient endorses some disturbed  sleep but states that his sleep has improved since taking his trazodone as scheduled.  Patient endorses good appetite and eats on average 2 meals per day.  Patient denies alcohol consumption, tobacco use, and illicit drug use.  Visit Diagnosis:    ICD-10-CM   1. Moderate  episode of recurrent major depressive disorder (HCC)  F33.1 buPROPion (WELLBUTRIN XL) 150 MG 24 hr tablet  2. Insomnia, unspecified type  G47.00 traZODone (DESYREL) 50 MG tablet  3. Generalized anxiety disorder  F41.1 hydrOXYzine (ATARAX/VISTARIL) 25 MG tablet    Past Psychiatric History:  Major Depressive Disorder Generalized Anxiety Disorder Insomnia   Past Medical History:  Past Medical History:  Diagnosis Date  . Anxiety   . Depression    No past surgical history on file.  Family Psychiatric History:  Unsure  Family History:  Family History  Problem Relation Age of Onset  . Healthy Mother   . Depression Mother   . Healthy Father     Social History:  Social History   Socioeconomic History  . Marital status: Single    Spouse name: Not on file  . Number of children: 0  . Years of education: Not on file  . Highest education level: High school graduate  Occupational History  . Not on file  Tobacco Use  . Smoking status: Never Smoker  . Smokeless tobacco: Never Used  Vaping Use  . Vaping Use: Never used  Substance and Sexual Activity  . Alcohol use: Never  . Drug use: Never  . Sexual activity: Not on file  Other Topics Concern  . Not on file  Social History Narrative  . Not on file   Social Determinants of Health   Financial Resource Strain: Not on file  Food Insecurity: Not on file  Transportation Needs: Not on file  Physical Activity: Not on file  Stress: Not on file  Social Connections: Not on file    Allergies: No Known Allergies  Metabolic Disorder Labs: Lab Results  Component Value Date   HGBA1C 5.2 07/02/2020   MPG 103 07/02/2020   No results found for: PROLACTIN Lab Results  Component Value Date   CHOL 184 07/02/2020   TRIG 207 (H) 07/02/2020   HDL 44 07/02/2020   CHOLHDL 4.2 07/02/2020   VLDL 41 (H) 07/02/2020   LDLCALC 99 07/02/2020   Lab Results  Component Value Date   TSH 4.570 (H) 07/02/2020    Therapeutic Level  Labs: No results found for: LITHIUM No results found for: VALPROATE No components found for:  CBMZ  Current Medications: Current Outpatient Medications  Medication Sig Dispense Refill  . acetaminophen (TYLENOL) 325 MG tablet Take 650 mg by mouth every 6 (six) hours as needed for moderate pain.    Marland Kitchen buPROPion (WELLBUTRIN XL) 150 MG 24 hr tablet Take 1 tablet (150 mg total) by mouth daily. 30 tablet 2  . hydrOXYzine (ATARAX/VISTARIL) 25 MG tablet Take 1 tablet (25 mg total) by mouth 3 (three) times daily as needed for anxiety. 90 tablet 1  . traZODone (DESYREL) 50 MG tablet Take 1 tablet (50 mg total) by mouth at bedtime as needed for sleep. 30 tablet 2   No current facility-administered medications for this visit.     Musculoskeletal: Strength & Muscle Tone: Unable to assess due to telemedicine visit Gait & Station: Unable to assess due to telemedicine visit Patient leans: Unable to assess due to telemedicine visit  Psychiatric Specialty Exam: Review of Systems  Psychiatric/Behavioral: Positive for sleep disturbance.  Negative for decreased concentration, dysphoric mood, hallucinations, self-injury and suicidal ideas. The patient is nervous/anxious. The patient is not hyperactive.     There were no vitals taken for this visit.There is no height or weight on file to calculate BMI.  General Appearance: Unable to assess due to telemedicine visit  Eye Contact:  Unable to assess due to telemedicine visit  Speech:  Clear and Coherent and Normal Rate  Volume:  Normal  Mood:  Anxious and Euthymic  Affect:  Congruent and Depressed  Thought Process:  Coherent and Descriptions of Associations: Intact  Orientation:  Full (Time, Place, and Person)  Thought Content: WDL and Logical   Suicidal Thoughts:  No  Homicidal Thoughts:  No  Memory:  Immediate;   Good Recent;   Good Remote;   Good  Judgement:  Good  Insight:  Fair  Psychomotor Activity:  Normal  Concentration:  Concentration: Good  and Attention Span: Good  Recall:  Good  Fund of Knowledge: Good  Language: Good  Akathisia:  NA  Handed:  Right  AIMS (if indicated): not done  Assets:  Communication Skills Desire for Improvement Financial Resources/Insurance Housing Social Support  ADL's:  Intact  Cognition: WNL  Sleep:  Fair   Screenings: AIMS   Flowsheet Row [facility]  AIMS Total Score 0    AUDIT   Flowsheet Row [facility]  Alcohol Use Disorder Identification Test Final Score (AUDIT) 2    GAD-7   Flow[facility]  Total GAD-7 Score 12    PHQ2-9   Flowsheet Row [facility]  PHQ-2 Total Score 3 5  PHQ-9 Total Score 12 22    Flowsheet Row [facility]  C-SSRS RISK CATEGORY High Risk Error: Q3, 4, or 5 should not be populated when Q2 is No No Risk       Assessment and Plan:   Riley Smith is a 20 year old with a past psychiatric history significant for major depressive disorder, generalized anxiety disorder, and insomnia who [facility] follow-up and medication management. He expresses no issues or concerns regarding his current medication regimen.  Patient reports that his mood has improved since the last encounter.  Patient does express that he recently tried to commit suicide due to ongoing relationship issues with his former girlfriend.  Patient reports that he has been doing much better and is scheduled for an  individual therapy appointment at the end of the month.  Patient is requesting refills on his medications following the conclusion of the encounter.  Patient's medications will be e-prescribed to pharmacy of choice.  1. Moderate episode of recurrent major depressive disorder (HCC)  - buPROPion (WELLBUTRIN XL) 150 MG 24 hr tablet; Take 1 tablet (150 mg total) by mouth daily.  Dispense: 30 tablet; Refill: 2  2. Insomnia, unspecified type  - traZODone (DESYREL) 50 MG tablet; Take 1 tablet (50 mg total) by mouth at bedtime as needed for sleep.  Dispense: 30 tablet; Refill: 2  3. Generalized anxiety disorder  - hydrOXYzine (ATARAX/VISTARIL) 25 MG tablet; Take 1 tablet (  25 mg total) by mouth 3 (three) times daily as needed for anxiety.  Dispense: 90 tablet; Refill: 1  Patient to follow-up in 2 months  Meta Hatchet, PA 12/02/2020, 5:01 PM

## 2020-12-08 ENCOUNTER — Ambulatory Visit (HOSPITAL_COMMUNITY): Payer: No Payment, Other | Admitting: Clinical

## 2020-12-08 ENCOUNTER — Other Ambulatory Visit: Payer: Self-pay

## 2020-12-21 ENCOUNTER — Inpatient Hospital Stay: Payer: Medicaid Other | Admitting: Family Medicine

## 2020-12-25 ENCOUNTER — Emergency Department (HOSPITAL_COMMUNITY)
Admission: EM | Admit: 2020-12-25 | Discharge: 2020-12-26 | Disposition: A | Discharge status: Other Acute Inpatient Hospital | Payer: Medicaid Other | Attending: Emergency Medicine | Admitting: Emergency Medicine

## 2020-12-25 ENCOUNTER — Other Ambulatory Visit: Payer: Self-pay

## 2020-12-25 DIAGNOSIS — R45851 Suicidal ideations: Secondary | ICD-10-CM

## 2020-12-25 DIAGNOSIS — K625 Hemorrhage of anus and rectum: Secondary | ICD-10-CM | POA: Insufficient documentation

## 2020-12-25 DIAGNOSIS — Z046 Encounter for general psychiatric examination, requested by authority: Secondary | ICD-10-CM | POA: Insufficient documentation

## 2020-12-25 DIAGNOSIS — Z20822 Contact with and (suspected) exposure to covid-19: Secondary | ICD-10-CM | POA: Insufficient documentation

## 2020-12-25 LAB — COMPREHENSIVE METABOLIC PANEL
ALT: 17 U/L (ref 0–44)
AST: 18 U/L (ref 15–41)
Albumin: 4.4 g/dL (ref 3.5–5.0)
Alkaline Phosphatase: 56 U/L (ref 38–126)
Anion gap: 8 (ref 5–15)
BUN: 7 mg/dL (ref 6–20)
CO2: 27 mmol/L (ref 22–32)
Calcium: 9.5 mg/dL (ref 8.9–10.3)
Chloride: 104 mmol/L (ref 98–111)
Creatinine, Ser: 0.94 mg/dL (ref 0.61–1.24)
GFR, Estimated: >60 mL/min (ref >60)
Glucose, Bld: 76 mg/dL (ref 70–99)
Potassium: 3.8 mmol/L (ref 3.5–5.1)
Sodium: 139 mmol/L (ref 135–145)
Total Bilirubin: 1.1 mg/dL (ref 0.3–1.2)
Total Protein: 7.7 g/dL (ref 6.5–8.1)

## 2020-12-25 LAB — CBC WITH DIFFERENTIAL/PLATELET
Abs Immature Granulocytes: 0.02 10*3/uL (ref 0.00–0.07)
Basophils Absolute: 0 10*3/uL (ref 0.0–0.1)
Basophils Relative: 1 %
Eosinophils Absolute: 0.1 10*3/uL (ref 0.0–0.5)
Eosinophils Relative: 1 %
HCT: 46.6 % (ref 39.0–52.0)
Hemoglobin: 16.2 g/dL (ref 13.0–17.0)
Immature Granulocytes: 0 %
Lymphocytes Relative: 24 %
Lymphs Abs: 1.5 10*3/uL (ref 0.7–4.0)
MCH: 29.2 pg (ref 26.0–34.0)
MCHC: 34.8 g/dL (ref 30.0–36.0)
MCV: 84.1 fL (ref 80.0–100.0)
Monocytes Absolute: 0.8 10*3/uL (ref 0.1–1.0)
Monocytes Relative: 12 %
Neutro Abs: 4.1 10*3/uL (ref 1.7–7.7)
Neutrophils Relative %: 62 %
Platelets: 372 10*3/uL (ref 150–400)
RBC: 5.54 MIL/uL (ref 4.22–5.81)
RDW: 12.5 % (ref 11.5–15.5)
WBC: 6.5 10*3/uL (ref 4.0–10.5)
nRBC: 0 % (ref 0.0–0.2)

## 2020-12-25 LAB — RAPID URINE DRUG SCREEN, HOSP PERFORMED
Amphetamines: NOT DETECTED
Barbiturates: NOT DETECTED
Benzodiazepines: NOT DETECTED
Cocaine: NOT DETECTED
Opiates: NOT DETECTED
Tetrahydrocannabinol: NOT DETECTED

## 2020-12-25 LAB — ACETAMINOPHEN LEVEL: Acetaminophen (Tylenol), Serum: <10 ug/mL — ABNORMAL LOW (ref 10–30)

## 2020-12-25 LAB — RESP PANEL BY RT-PCR (FLU A&B, COVID) ARPGX2
Influenza A by PCR: NEGATIVE
Influenza B by PCR: NEGATIVE
SARS Coronavirus 2 by RT PCR: NEGATIVE

## 2020-12-25 LAB — ETHANOL: Alcohol, Ethyl (B): <10 mg/dL (ref <10)

## 2020-12-25 LAB — SALICYLATE LEVEL: Salicylate Lvl: <7 mg/dL — ABNORMAL LOW (ref 7.0–30.0)
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
## 2020-12-25 NOTE — ED Notes (Signed)
Patient dressed out in purple scrubs, wanded by security. All belongings collected and are with nursing staff at this time.

## 2020-12-25 NOTE — ED Triage Notes (Signed)
Pt here pov with reports of blood in his stool X2 this week. Pt also endorses feeling SI. Calm and cooperative in triage.

## 2020-12-25 NOTE — ED Notes (Signed)
Dr countryman at bedside 

## 2020-12-25 NOTE — ED Provider Notes (Signed)
Patient placed in Quick Look pathway, seen and evaluated   Chief Complaint: blood in stool and suicidal   HPI:   Pt noticed blood in his stool and has been having thoughts of suicide.    ROS: no abdominal pain no chest pain  Physical Exam:   Gen: No distress  Neuro: Awake and Alert  Skin: Warm    Focused Exam: wdwn Lungs clear heart rrr    Initiation of care has begun. The patient has been counseled on the process, plan, and necessity for staying for the completion/evaluation, and the remainder of the medical screening examination   Riley Smith 12/25/20 1448    Koleen Distance, MD 12/25/20 [phone]

## 2020-12-25 NOTE — BH Assessment (Signed)
TTS spoke to [employer], to put pt in a private room to complete TTS assessment.  Clinician to call the cart in ten minutes.

## 2020-12-25 NOTE — ED Provider Notes (Signed)
[facility] EMERGENCY DEPARTMENT Provider Note   CSN: [id number] Arrival date & time: 12/25/20  1348     History No chief complaint on file.   Riley Smith is a 20 y.o. male.  HPI Patient is a 20 year old male with a past medical history of anxiety depression with 2 previous suicide attempts by overdose in the past year presenting with a chief complaint of recurrent suicidal ideation as well as bright red blood per rectum.  Patient states has been having issues with constipation and pushing hard during bowel movements, twice in the past week he has noticed streaks of red inside of the toilet after particularly hard bowel movements.  He denies fevers or chills, nausea vomiting syncope or shortness of breath this time.  Patient is otherwise healthy and up-to-date on his medications.  Patient follows with psychiatry in the outpatient setting and has been able to take his trazodone, Atarax, bupropion.  He states that he has been having fights with his mom at home which is his new living situation and this is triggering him to make him feel suicidal.  He denies any plan on performing suicide at this time.  He endorses history of 2 overdoses however. Patient states that he would not feel suicidal if he had a different place to live.  Past Medical History:  Diagnosis Date  . Anxiety   . Depression     Patient Active Problem List   Diagnosis Date Noted  . Suicidal ideation   . Suicidal ideations 10/31/2020  . Insomnia 07/14/2020  . Severe episode of recurrent major depressive disorder, without psychotic features (HCC) 07/02/2020  . Suicide attempt (HCC) 07/02/2020  . Anxiety state 07/02/2020    No past surgical history on file.     Family History  Problem Relation Age of Onset  . Healthy Mother   . Depression Mother   . Healthy Father     Social History   Tobacco Use  . Smoking status: Never Smoker  . Smokeless tobacco: Never Used  Vaping Use  . Vaping  Use: Never used  Substance Use Topics  . Alcohol use: Never  . Drug use: Never    Home Medications Prior to Admission medications   Medication Sig Start Date End Date Taking? Authorizing Provider  acetaminophen (TYLENOL) 325 MG tablet Take 650 mg by mouth every 6 (six) hours as needed for moderate pain.    [provider]  buPROPion (WELLBUTRIN XL) 150 MG 24 hr tablet TAKE 1 TABLET (150 MG TOTAL) BY MOUTH DAILY. 12/02/20 12/02/21  Nwoko, Stephens Shire E, PA  hydrOXYzine (ATARAX/VISTARIL) 25 MG tablet TAKE 1 TABLET (25 MG TOTAL) BY MOUTH 3 (THREE) TIMES DAILY AS NEEDED FOR ANXIETY. 12/02/20 12/02/21  Nwoko, Tommas Olp, PA  traZODone (DESYREL) 50 MG tablet TAKE 1 TABLET (50 MG TOTAL) BY MOUTH AT BEDTIME AS NEEDED FOR SLEEP. 12/02/20 12/02/21  Meta Hatchet, PA    Allergies    Patient has no known allergies.  Review of Systems   Review of Systems  Constitutional: Negative for chills and fever.  HENT: Negative for ear pain and sore throat.   Eyes: Negative for pain and visual disturbance.  Respiratory: Negative for cough and shortness of breath.   Cardiovascular: Negative for chest pain and palpitations.  Gastrointestinal: Positive for blood in stool. Negative for abdominal pain and vomiting.  Genitourinary: Negative for dysuria and hematuria.  Musculoskeletal: Negative for arthralgias and back pain.  Skin: Negative for color change and rash.  Neurological: Negative for seizures and syncope.  Psychiatric/Behavioral: Positive for suicidal ideas.  All other systems reviewed and are negative.   Physical Exam Updated Vital Signs BP 99/76 (BP Location: Left Arm)   Pulse 84   Temp 98.6 F (37 C) (Oral)   Resp 16   SpO2 98%   Physical Exam Vitals and nursing note reviewed.  Constitutional:      Appearance: He is well-developed.  HENT:     Head: Normocephalic and atraumatic.  Eyes:     Conjunctiva/sclera: Conjunctivae normal.  Cardiovascular:     Rate and Rhythm: Normal rate  and regular rhythm.     Heart sounds: No murmur heard.   Pulmonary:     Effort: Pulmonary effort is normal. No respiratory distress.     Breath sounds: Normal breath sounds.  Abdominal:     Palpations: Abdomen is soft.     Tenderness: There is no abdominal tenderness.  Musculoskeletal:     Cervical back: Neck supple.  Skin:    General: Skin is warm and dry.  Neurological:     Mental Status: He is alert.     ED Results / Procedures / Treatments   Labs (all labs ordered are listed, but only abnormal results are displayed) Labs Reviewed  ACETAMINOPHEN LEVEL - Abnormal; Notable for the following components:      Result Value   Acetaminophen (Tylenol), Serum <10 (*)    All other components within normal limits  SALICYLATE LEVEL - Abnormal; Notable for the following components:   Salicylate Lvl <7.0 (*)    All other components within normal limits  RESP PANEL BY RT-PCR (FLU A&B, COVID) ARPGX2  COMPREHENSIVE METABOLIC PANEL  ETHANOL  CBC WITH DIFFERENTIAL/PLATELET  RAPID URINE DRUG SCREEN, HOSP PERFORMED  POC OCCULT BLOOD, ED    EKG None  Radiology No results found.  Procedures Procedures   Medications Ordered in ED Medications - No data to display  ED Course  I have reviewed the triage vital signs and the nursing notes.  Pertinent labs & imaging results that were available during my care of the patient were reviewed by me and considered in my medical decision making (see chart for details).    MDM Rules/Calculators/A&P                          Medical Decision Making:  Riley Smith is a 20 y.o. male who presented to the ED today for psychiatric evaluation.  Patient is endorsing SI with a plan.  Patient does have a history of SA x2 for which they are on trazodone and buproprion.  They are compliant with their medications. On my initial exam, the pt was linear in thought, appropriate in affect, and overall well-appearing.  Vital signs reviewed and  reassuring. Reviewed and confirmed nursing documentation for past medical history, family history, social history.   Initial Assessment:  With the patient's presentation of SI, patient warrants emergent psychiatric consultation.  Initial Plan: 1. Patient immediately placed into ED psychiatric hold protocol including suicide precautions, elopement precautions and vital sign monitoring.   2. Emergent behavioral health hold signed and notarized while awaiting psychiatric consultation due to threat to self or others. 3. Medical screening evaluation ordered and patient stable for TTS team evaluation at this time.  Medically cleared.   Final Assessment and Plan:  Psychiatry recommendations pending at this time.  Final Clinical Impression(s) / ED Diagnoses Final diagnoses:  None    Rx /  DC Orders ED Discharge Orders    None       Glyn Ade, MD 12/25/20 2212    Margarita Grizzle, MD 12/25/20 [phone]

## 2020-12-26 ENCOUNTER — Other Ambulatory Visit: Payer: Self-pay

## 2020-12-26 ENCOUNTER — Ambulatory Visit (HOSPITAL_COMMUNITY)
Admission: EM | Admit: 2020-12-26 | Discharge: 2020-12-26 | Disposition: A | Discharge status: Home / Self Care | Payer: No Payment, Other | Attending: Student | Admitting: Student

## 2020-12-26 DIAGNOSIS — F332 Major depressive disorder, recurrent severe without psychotic features: Secondary | ICD-10-CM | POA: Insufficient documentation

## 2020-12-26 DIAGNOSIS — R45851 Suicidal ideations: Secondary | ICD-10-CM | POA: Insufficient documentation

## 2020-12-26 DIAGNOSIS — F411 Generalized anxiety disorder: Secondary | ICD-10-CM | POA: Insufficient documentation

## 2020-12-26 MED ORDER — HYDROXYZINE HCL 25 MG PO TABS: 25.0000 mg | ORAL_TABLET | Freq: Three times a day (TID) | ORAL | Status: DC | PRN

## 2020-12-26 MED ORDER — MAGNESIUM HYDROXIDE 400 MG/5ML PO SUSP: 30.0000 mL | Freq: Every day | ORAL | Status: DC | PRN

## 2020-12-26 MED ORDER — FAMOTIDINE 20 MG PO TABS
20.0000 mg | ORAL_TABLET | Freq: Every day | ORAL | Status: DC
  Administered 2020-12-26: 20 mg via ORAL
  Filled 2020-12-26: qty 1

## 2020-12-26 MED ORDER — ALUM & MAG HYDROXIDE-SIMETH 200-200-20 MG/5ML PO SUSP: 30.0000 mL | ORAL | Status: DC | PRN

## 2020-12-26 MED ORDER — TRAZODONE HCL 50 MG PO TABS: 50.0000 mg | ORAL_TABLET | Freq: Every evening | ORAL | Status: DC | PRN

## 2020-12-26 MED ORDER — ACETAMINOPHEN 325 MG PO TABS: 650.0000 mg | ORAL_TABLET | Freq: Four times a day (QID) | ORAL | Status: DC | PRN

## 2020-12-26 MED ORDER — BUPROPION HCL ER (XL) 150 MG PO TB24
150.0000 mg | ORAL_TABLET | Freq: Every day | ORAL | Status: DC
  Administered 2020-12-26: 150 mg via ORAL
  Filled 2020-12-26: qty 1

## 2020-12-26 NOTE — ED Notes (Signed)
Resident property is in locker 7

## 2020-12-26 NOTE — ED Provider Notes (Addendum)
Behavioral Health Admission [facility]&P [facility] & OBS)  Date: 12/26/20 Patient Name: Riley Smith MRN: [id number] Chief Complaint:  Chief Complaint  Patient presents with  . Suicidal      Diagnoses:  Final diagnoses:  Severe episode of recurrent major depressive disorder, without psychotic features (HCC)  Suicidal ideation  GAD (generalized anxiety disorder)    HPI: Riley Smith is a 20 year old male with history of MDD, GAD, insomnia, and multiple past suicide attempts who presents to the behavioral health urgent care as a voluntary direct admit transfer from [facility] emergency department.  Per chart review, patient presented to [facility] emergency department on December 25, 2020 for SI.  Patient was evaluated by TTS on December 25, 2020 and recommendation was made by Leandro Reasoner, NP with the patient to be transferred to [facility] for continuous observation/assessment for further stabilization and treatment.  Please see Silverio Lay, Counselor 12/25/20 BH/CCA assessment note for further details regarding TTS assessment if necessary.  Patient evaluated by myself upon his arrival to the [facility] from [facility] emergency department.  Patient states that he went to the emergency department on December 25, 2020 "because I was feeling suicidal and had suicidal thoughts".  Patient states that on December 25, 2020 prior to going to the ED, he had SI with multiple plans to jump into traffic (patient states "the quickest and easiest way was to find the nearest highway and jump off into oncoming traffic"), cut himself (patient does not specify where on his body he would cut himself), or cut his throat with a knife.  Patient endorses SI currently on exam with the same plans mentioned in the previous sentence (patient is adamant that these may not necessarily be considered "plans", but rather may be considered ways that he could harm/kill himself if he wanted to).  Patient endorses history of self-injurious behavior via cutting  himself with a pocket knife.  Patient states that the last time he cut himself was a few months ago, in which he cut his left arm with a pocket knife as part of a suicide attempt.  Patient states that prior to this incident, he had last cut his left arm on another occasion with a pocket knife in October 2021 as an additional suicide attempt.  Prior to October 2021, patient states that he had not cut himself for multiple years.  Patient denies any history of cutting himself with a knife.  Patient does state that he owns 3 pocketknives and 1 survival knife, but he denies access to guns or additional weapons.  Patient denies any history of intentionally burning himself.  Patient reports 2 past suicide attempts: 1) patient states a few months ago, he overdosed on his psychotropic medications (Wellbutrin, Vistaril, and trazodone) and cut his left arm with a pocket knife, 2) patient states that in October 2021, he overdosed on "sleep aids and Tylenol" and cut his left arm with a pocket knife.  Patient denies HI, AVH, paranoia, or delusions.  Patient states that he has been feeling very stressed and depressed for the past year, and patient acknowledges that his depression and anxiety is "ongoing".  Patient initially states that there are no specific triggers that have increased his depression and anxiety recently, but then he does endorse that the relationship that he has with his mother has been negative for his mental health.  Patient states that his relationship with his mother is not great and that his mother is always complaining to him about things that  he does, which stresses him out.  Patient also states that he has been "constantly arguing" with his ex-girlfriend recently and he states that this, in conjunction with thinking about his ex-girlfriend frequently has also appeared to worsen his depression.  Patient endorses poor sleep, insomnia, as well as hypersomnia at times, stating that sometimes he may not  sleep that much for sleep too much.  Patient reports that he frequently will wake up in the middle of the night/at 4 AM.  He endorses anhedonia, as well as feelings of guilt, hopelessness, and worthlessness over the past few months.  He endorses recent decline in energy as well as concentration.  He reports decreased appetite over the past month but he denies any weight changes.  Patient states that he is seeing both a psychiatric provider for medication management and a therapist at the [facility] (2nd [facility]) as an outpatient.  He states that his last medication management appointment was at the end of March 2022.  He states that his last therapy appointment was supposed to be on April 2, but he states that he missed this appointment.  Patient states he is unsure of when his next medication management and therapy appointments are.  Per chart review, patient had a progress visit with Ileene Musa, PA-C on 12/02/20 with plan to continue psychotropic medication regimen of Wellbutrin XL 150 mg 24-hour tablet p.o. daily for MDD, trazodone 50 mg p.o. at bedtime as needed for sleep/insomnia, and Vistaril 25 mg p.o. 3 times daily as needed for anxiety/GAD and instructions to follow-up in 2 months from that visit.  Patient states that he has been taking these medications as prescribed.  Chart review also shows that patient was admitted to [facility] on 07/02/21 for severe recurrent major depression/suicide attempt of overdosing on 8 200 mg ibuprofen pills and 8 Benadryl pills and was discharged on 07/04/20.  Patient denies any additional psychiatric admissions since this admission in October 2021.  Chart review also shows that the patient presented to the behavioral health urgent care on October 31, 2020 for reported suicide attempt/overdose (3-4 150 mg Wellbutrin pills, 4 50 mg Trazodone pills, 3 25 mg Vistaril pills, and 5 tylenol pills) with cutting.  Due to patient testing positive for  COVID on 10/26/20, patient was then transferred to the emergency department with recommendation being inpatient psychiatric treatment once the patient was medically cleared in the emergency department.  Per chart review, upon reevaluation by psychiatry on 11/02/20, it appears that patient no longer met inpatient psychiatric treatment criteria and was advised to begin a PHP program at [facility] behavioral health outpatient in [place] clinic.  It appears that the patient began PHP program on 11/04/20 and was discharged from [facility] program on November 10, 2020 prior to his most recent visit [facility] psychiatry/medication management visit on 12/02/20.   Patient lives in [place] with his mother, 28 year old sister, 18 year old sister, and 80-year-old sister.  Patient denies any alcohol, tobacco, or additional substance use.  Patient is currently employed as a Scientist, water quality at [employer].  Patient is not currently in school and his highest level of education is high school diploma.  PHQ 2-9:  Flowsheet Row Video Visit from 12/02/2020 in [facility] Counselor from 11/04/2020 in [facility]  Thoughts that you would be better off dead, or of hurting yourself in some way Several days More than half the days  PHQ-9 Total Score 12 22  [place] ED from 12/25/2020 in [place] Video Visit from 12/02/2020 in [facility] Counselor from 11/04/2020 in [place] CATEGORY Moderate Risk High Risk Error: Q3, 4, or 5 should not be populated when Q2 is No       Total Time spent with patient: 30 minutes  Musculoskeletal  Strength & Muscle Tone: within normal limits Gait & Station: normal Patient leans: N/A  Psychiatric Specialty Exam  Presentation General Appearance: Appropriate for Environment; Well Groomed  Eye  Contact:Good  Speech:Clear and Coherent; Normal Rate  Speech Volume:Normal  Handedness:Left   Mood and Affect  Mood:Depressed  Affect:Congruent   Thought Process  Thought Processes:Coherent; Goal Directed; Linear  Descriptions of Associations:Intact  Orientation:Full (Time, Place and Person)  Thought Content:Logical; WDL  Diagnosis of Schizophrenia or Schizoaffective disorder in past: No   Hallucinations:Hallucinations: None  Ideas of Reference:None  Suicidal Thoughts:Suicidal Thoughts: Yes, Active SI Active Intent and/or Plan: With Intent; With Plan; With Means to [place]; With Access to Means  Homicidal Thoughts:Homicidal Thoughts: No   Sensorium  Memory:Immediate Good; Recent Good; Remote Good  Judgment:Fair  Insight:Fair   Executive Functions  Concentration:Good  Attention Span:Good  Albers  Language:Good   Psychomotor Activity  Psychomotor Activity:Psychomotor Activity: Mannerisms (Patient demonstrates random episodes of blinking throughout the assessment.)   Assets  Assets:Communication Skills; Desire for Improvement; Housing; Leisure Time; Physical Health; Vocational/Educational   Sleep  Sleep:Sleep: Poor   Nutritional Assessment (For OBS and FBC admissions only) Has the patient had a weight loss or gain of 10 pounds or more in the last 3 months?: No Has the patient had a decrease in food intake/or appetite?: Yes Does the patient have dental problems?: No Does the patient have eating habits or behaviors that may be indicators of an eating disorder including binging or inducing vomiting?: No Has the patient recently lost weight without trying?: No Has the patient been eating poorly because of a decreased appetite?: Yes Malnutrition Screening Tool Score: 1    Physical Exam Vitals reviewed.  Constitutional:      General: He is not in acute distress.    Appearance: He is not ill-appearing,  toxic-appearing or diaphoretic.  HENT:     Head: Normocephalic and atraumatic.     Right Ear: External ear normal.     Left Ear: External ear normal.  Cardiovascular:     Rate and Rhythm: Normal rate.  Pulmonary:     Effort: Pulmonary effort is normal. No respiratory distress.  Musculoskeletal:        General: Normal range of motion.     Cervical back: Normal range of motion.  Neurological:     Mental Status: He is alert and oriented to person, place, and time.    Review of Systems  Constitutional: Positive for malaise/fatigue. Negative for chills, diaphoresis, fever and weight loss.  HENT: Negative for congestion.   Respiratory: Negative for cough and shortness of breath.   Cardiovascular: Negative for chest pain and palpitations.  Gastrointestinal: Positive for heartburn. Negative for abdominal pain, constipation, diarrhea, nausea and vomiting.       Patient endorses history of GERD-like symptoms/heartburn and states he takes Famotidine 20 mg once daily OTC for these symptoms.   Musculoskeletal: Negative for joint pain and myalgias.  Neurological: Negative for dizziness and headaches.  Psychiatric/Behavioral: Positive for depression and suicidal ideas. Negative for hallucinations, memory loss and substance abuse. The patient  is nervous/anxious and has insomnia.   All other systems reviewed and are negative.   Vitals: Blood pressure 111/69, pulse 81, temperature 97.7 F (36.5 C), temperature source Tympanic, resp. rate 18, SpO2 99 %. There is no height or weight on file to calculate BMI.  Past Psychiatric History: MDD, Insomnia, GAD, Multiple Suicide Attempts   Is the patient at risk to self? Yes  Has the patient been a risk to self in the past 6 months? Yes .    Has the patient been a risk to self within the distant past? No   Is the patient a risk to others? No   Has the patient been a risk to others in the past 6 months? No   Has the patient been a risk to others within  the distant past? No   Past Medical History:  Past Medical History:  Diagnosis Date  . Anxiety   . Depression    No past surgical history on file.  Family History:  Family History  Problem Relation Age of Onset  . Healthy Mother   . Depression Mother   . Healthy Father     Social History:  Social History   Socioeconomic History  . Marital status: Single    Spouse name: Not on file  . Number of children: 0  . Years of education: Not on file  . Highest education level: High school graduate  Occupational History  . Not on file  Tobacco Use  . Smoking status: Never Smoker  . Smokeless tobacco: Never Used  Vaping Use  . Vaping Use: Never used  Substance and Sexual Activity  . Alcohol use: Never  . Drug use: Never  . Sexual activity: Not on file  Other Topics Concern  . Not on file  Social History Narrative  . Not on file   Social Determinants of Health   Financial Resource Strain: Not on file  Food Insecurity: Not on file  Transportation Needs: Not on file  Physical Activity: Not on file  Stress: Not on file  Social Connections: Not on file  Intimate Partner Violence: Not on file    SDOH:  SDOH Screenings   Alcohol Screen: Low Risk   . Last Alcohol Screening Score (AUDIT): 2  Depression (PHQ2-9): Medium Risk  . PHQ-2 Score: 12  Financial Resource Strain: Not on file  Food Insecurity: Not on file  Housing: Not on file  Physical Activity: Not on file  Social Connections: Not on file  Stress: Not on file  Tobacco Use: Low Risk   . Smoking Tobacco Use: Never Smoker  . Smokeless Tobacco Use: Never Used  Transportation Needs: Not on file    Last Labs:  Admission on 12/25/2020, Discharged on 12/26/2020  Component Date Value Ref Range Status  . SARS Coronavirus 2 by RT PCR 12/25/2020 NEGATIVE  NEGATIVE Final   Comment: (NOTE) SARS-CoV-2 target nucleic acids are NOT DETECTED.  The SARS-CoV-2 RNA is generally detectable in upper respiratory specimens  during the acute phase of infection. The lowest concentration of SARS-CoV-2 viral copies this assay can detect is 138 copies/mL. A negative result does not preclude SARS-Cov-2 infection and should not be used as the sole basis for treatment or other patient management decisions. A negative result may occur with  improper specimen collection/handling, submission of specimen other than nasopharyngeal swab, presence of viral mutation(s) within the areas targeted by this assay, and inadequate number of viral copies(<138 copies/mL). A negative result must be combined with clinical  observations, patient history, and epidemiological information. The expected result is Negative.  Fact Sheet for Patients:  EntrepreneurPulse.com.au  Fact Sheet for Healthcare Providers:  IncredibleEmployment.be  This test is no                          t yet approved or cleared by the [place] FDA and  has been authorized for detection and/or diagnosis of SARS-CoV-2 by FDA under an Emergency Use Authorization (EUA). This EUA will remain  in effect (meaning this test can be used) for the duration of the COVID-19 declaration under Section 564(b)(1) of the Act, 21 U.S.C.section 360bbb-3(b)(1), unless the authorization is terminated  or revoked sooner.      . Influenza A by PCR 12/25/2020 NEGATIVE  NEGATIVE Final  . Influenza B by PCR 12/25/2020 NEGATIVE  NEGATIVE Final   Comment: (NOTE) The Xpert Xpress SARS-CoV-2/FLU/RSV plus assay is intended as an aid in the diagnosis of influenza from Nasopharyngeal swab specimens and should not be used as a sole basis for treatment. Nasal washings and aspirates are unacceptable for Xpert Xpress SARS-CoV-2/FLU/RSV testing.  Fact Sheet for Patients: EntrepreneurPulse.com.au  Fact Sheet for Healthcare Providers: IncredibleEmployment.be  This test is not yet approved or cleared by the [place] FDA and has been authorized for detection and/or diagnosis of SARS-CoV-2 by FDA under an Emergency Use Authorization (EUA). This EUA will remain in effect (meaning this test can be used) for the duration of the COVID-19 declaration under Section 564(b)(1) of the Act, 21 U.S.C. section 360bbb-3(b)(1), unless the authorization is terminated or revoked.  Performed at [facility], [place] [address], [place], [place] [address]   . Sodium 12/25/2020 139  135 - 145 mmol/L Final  . Potassium 12/25/2020 3.8  3.5 - 5.1 mmol/L Final  . Chloride 12/25/2020 104  98 - 111 mmol/L Final  . CO2 12/25/2020 27  22 - 32 mmol/L Final  . Glucose, Bld 12/25/2020 76  70 - 99 mg/dL Final   Glucose reference range applies only to samples taken after fasting for at least 8 hours.  . BUN 12/25/2020 7  6 - 20 mg/dL Final  . Creatinine, Ser 12/25/2020 0.94  0.61 - 1.24 mg/dL Final  . Calcium 12/25/2020 9.5  8.9 - 10.3 mg/dL Final  . Total Protein 12/25/2020 7.7  6.5 - 8.1 g/dL Final  . Albumin 12/25/2020 4.4  3.5 - 5.0 g/dL Final  . AST 12/25/2020 18  15 - 41 U/L Final  . ALT 12/25/2020 17  0 - 44 U/L Final  . Alkaline Phosphatase 12/25/2020 56  38 - 126 U/L Final  . Total Bilirubin 12/25/2020 1.1  0.3 - 1.2 mg/dL Final  . GFR, Estimated 12/25/2020 >60  >60 mL/min Final   Comment: (NOTE) Calculated using the CKD-EPI Creatinine Equation (2021)   . Anion gap 12/25/2020 8  5 - 15 Final   Performed at [place] [address], [place], [place] [address]  . Alcohol, Ethyl (B) 12/25/2020 <10  <10 mg/dL Final   Comment: (NOTE) Lowest detectable limit for serum alcohol is 10 mg/dL.  For medical purposes only. Performed at [facility], [place] [address], [place], [place] [address]   . Opiates 12/25/2020 NONE DETECTED  NONE DETECTED Final  . Cocaine 12/25/2020 NONE DETECTED  NONE DETECTED Final  . Benzodiazepines 12/25/2020 NONE DETECTED  NONE DETECTED Final  . Amphetamines 12/25/2020  NONE DETECTED  NONE DETECTED Final  .  Tetrahydrocannabinol 12/25/2020 NONE DETECTED  NONE DETECTED Final  . Barbiturates 12/25/2020 NONE DETECTED  NONE DETECTED Final   Comment: (NOTE) DRUG SCREEN FOR MEDICAL PURPOSES ONLY.  IF CONFIRMATION IS NEEDED FOR ANY PURPOSE, NOTIFY LAB WITHIN 5 DAYS.  LOWEST DETECTABLE LIMITS FOR URINE DRUG SCREEN Drug Class                     Cutoff (ng/mL) Amphetamine and metabolites    1000 Barbiturate and metabolites    200 Benzodiazepine                 182 Tricyclics and metabolites     300 Opiates and metabolites        300 Cocaine and metabolites        300 THC                            50 Performed at [facility], [place] [address] [place], [place] [address]   . WBC 12/25/2020 6.5  4.0 - 10.5 K/uL Final  . RBC 12/25/2020 5.54  4.22 - 5.81 MIL/uL Final  . Hemoglobin 12/25/2020 16.2  13.0 - 17.0 g/dL Final  . HCT 12/25/2020 46.6  39.0 - 52.0 % Final  . MCV 12/25/2020 84.1  80.0 - 100.0 fL Final  . MCH 12/25/2020 29.2  26.0 - 34.0 pg Final  . MCHC 12/25/2020 34.8  30.0 - 36.0 g/dL Final  . RDW 12/25/2020 12.5  11.5 - 15.5 % Final  . Platelets 12/25/2020 372  150 - 400 K/uL Final  . nRBC 12/25/2020 0.0  0.0 - 0.2 % Final  . Neutrophils Relative % 12/25/2020 62  % Final  . Neutro Abs 12/25/2020 4.1  1.7 - 7.7 K/uL Final  . Lymphocytes Relative 12/25/2020 24  % Final  . Lymphs Abs 12/25/2020 1.5  0.7 - 4.0 K/uL Final  . Monocytes Relative 12/25/2020 12  % Final  . Monocytes Absolute 12/25/2020 0.8  0.1 - 1.0 K/uL Final  . Eosinophils Relative 12/25/2020 1  % Final  . Eosinophils Absolute 12/25/2020 0.1  0.0 - 0.5 K/uL Final  . Basophils Relative 12/25/2020 1  % Final  . Basophils Absolute 12/25/2020 0.0  0.0 - 0.1 K/uL Final  . Immature Granulocytes 12/25/2020 0  % Final  . Abs Immature Granulocytes 12/25/2020 0.02  0.00 - 0.07 K/uL Final   Performed at [facility], [place] [address] [place], [place] [address]  . Acetaminophen  (Tylenol), Serum 12/25/2020 <10* 10 - 30 ug/mL Final   Comment: (NOTE) Therapeutic concentrations vary significantly. A range of 10-30 ug/mL  may be an effective concentration for many patients. However, some  are best treated at concentrations outside of this range. Acetaminophen concentrations >150 ug/mL at 4 hours after ingestion  and >50 ug/mL at 12 hours after ingestion are often associated with  toxic reactions.  Performed at [facility], [place] [address] [place], [place] [address]   . Salicylate Lvl 17/51/0258 <7.0* 7.0 - 30.0 mg/dL Final   Performed at [place] [address] [place], [place] [address]  Admission on 10/31/2020, Discharged on 11/02/2020  Component Date Value Ref Range Status  . Sodium 10/31/2020 137  135 - 145 mmol/L Final  . Potassium 10/31/2020 3.7  3.5 - 5.1 mmol/L Final  . Chloride 10/31/2020 101  98 - 111 mmol/L Final  . CO2 10/31/2020 26  22 - 32  mmol/L Final  . Glucose, Bld 10/31/2020 91  70 - 99 mg/dL Final   Glucose reference range applies only to samples taken after fasting for at least 8 hours.  . BUN 10/31/2020 6  6 - 20 mg/dL Final  . Creatinine, Ser 10/31/2020 0.99  0.61 - 1.24 mg/dL Final  . Calcium 10/31/2020 9.3  8.9 - 10.3 mg/dL Final  . Total Protein 10/31/2020 8.1  6.5 - 8.1 g/dL Final  . Albumin 10/31/2020 4.8  3.5 - 5.0 g/dL Final  . AST 10/31/2020 19  15 - 41 U/L Final  . ALT 10/31/2020 18  0 - 44 U/L Final  . Alkaline Phosphatase 10/31/2020 52  38 - 126 U/L Final  . Total Bilirubin 10/31/2020 1.2  0.3 - 1.2 mg/dL Final  . GFR, Estimated 10/31/2020 >60  >60 mL/min Final   Comment: (NOTE) Calculated using the CKD-EPI Creatinine Equation (2021)   . Anion gap 10/31/2020 10  5 - 15 Final   Performed at [facility], [place] [address], [place], [place] [address]  . WBC 10/31/2020 6.0  4.0 - 10.5 K/uL Final  . RBC 10/31/2020 5.49  4.22 - 5.81 MIL/uL Final  . Hemoglobin 10/31/2020 16.2  13.0 - 17.0 g/dL  Final  . HCT 10/31/2020 46.4  39.0 - 52.0 % Final  . MCV 10/31/2020 84.5  80.0 - 100.0 fL Final  . MCH 10/31/2020 29.5  26.0 - 34.0 pg Final  . MCHC 10/31/2020 34.9  30.0 - 36.0 g/dL Final  . RDW 10/31/2020 12.3  11.5 - 15.5 % Final  . Platelets 10/31/2020 336  150 - 400 K/uL Final  . nRBC 10/31/2020 0.0  0.0 - 0.2 % Final  . Neutrophils Relative % 10/31/2020 64  % Final  . Neutro Abs 10/31/2020 3.8  1.7 - 7.7 K/uL Final  . Lymphocytes Relative 10/31/2020 26  % Final  . Lymphs Abs 10/31/2020 1.6  0.7 - 4.0 K/uL Final  . Monocytes Relative 10/31/2020 8  % Final  . Monocytes Absolute 10/31/2020 0.5  0.1 - 1.0 K/uL Final  . Eosinophils Relative 10/31/2020 1  % Final  . Eosinophils Absolute 10/31/2020 0.1  0.0 - 0.5 K/uL Final  . Basophils Relative 10/31/2020 1  % Final  . Basophils Absolute 10/31/2020 0.0  0.0 - 0.1 K/uL Final  . Immature Granulocytes 10/31/2020 0  % Final  . Abs Immature Granulocytes 10/31/2020 0.01  0.00 - 0.07 K/uL Final   Performed at [facility], [place] [address] [place], [place] [address]  . Acetaminophen (Tylenol), Serum 10/31/2020 <10* 10 - 30 ug/mL Final   Comment: (NOTE) Therapeutic concentrations vary significantly. A range of 10-30 ug/mL  may be an effective concentration for many patients. However, some  are best treated at concentrations outside of this range. Acetaminophen concentrations >150 ug/mL at 4 hours after ingestion  and >50 ug/mL at 12 hours after ingestion are often associated with  toxic reactions.  Performed at [facility], [place] [address] [place], [place] [address]   . Salicylate Lvl 11/65/7903 <7.0* 7.0 - 30.0 mg/dL Final   Performed at [place] [address] [place], [place] [address]  . Opiates 10/31/2020 NONE DETECTED  NONE DETECTED Final  . Cocaine 10/31/2020 NONE DETECTED  NONE DETECTED Final  . Benzodiazepines 10/31/2020 NONE DETECTED  NONE DETECTED Final  .  Amphetamines 10/31/2020 NONE DETECTED  NONE DETECTED Final  . Tetrahydrocannabinol 10/31/2020 NONE DETECTED  NONE DETECTED Final  .  Barbiturates 10/31/2020 NONE DETECTED  NONE DETECTED Final   Comment: (NOTE) DRUG SCREEN FOR MEDICAL PURPOSES ONLY.  IF CONFIRMATION IS NEEDED FOR ANY PURPOSE, NOTIFY LAB WITHIN 5 DAYS.  LOWEST DETECTABLE LIMITS FOR URINE DRUG SCREEN Drug Class                     Cutoff (ng/mL) Amphetamine and metabolites    1000 Barbiturate and metabolites    200 Benzodiazepine                 407 Tricyclics and metabolites     300 Opiates and metabolites        300 Cocaine and metabolites        300 THC                            50 Performed at [facility], [place] [address], [place], [place] [address]   . Alcohol, Ethyl (B) 10/31/2020 <10  <10 mg/dL Final   Comment: (NOTE) Lowest detectable limit for serum alcohol is 10 mg/dL.  For medical purposes only. Performed at [facility], [place] [address] [place], [place] [address]   Admission on 10/26/2020, Discharged on 10/26/2020  Component Date Value Ref Range Status  . SARS Coronavirus 2 10/26/2020 POSITIVE* NEGATIVE Final   Comment: (NOTE) SARS-CoV-2 target nucleic acids are DETECTED.  The SARS-CoV-2 RNA is generally detectable in upper and lower respiratory specimens during the acute phase of infection. Positive results are indicative of the presence of SARS-CoV-2 RNA. Clinical correlation with patient history and other diagnostic information is  necessary to determine patient infection status. Positive results do not rule out bacterial infection or co-infection with other viruses.  The expected result is Negative.  Fact Sheet for Patients: SugarRoll.be  Fact Sheet for Healthcare Providers: https://www.woods-mathews.com/  This test is not yet approved or cleared by the [place] FDA and  has been authorized for detection  and/or diagnosis of SARS-CoV-2 by FDA under an Emergency Use Authorization (EUA). This EUA will remain  in effect (meaning this test can be used) for the duration of the COVID-19 declaration under Section 564(b)(1) of the Act, 21 U.                          S.C. section 360bbb-3(b)(1), unless the authorization is terminated or revoked sooner.   Performed at [facility], [place] [address] [place], [place] [address]   Admission on 07/02/2020, Discharged on 07/04/2020  Component Date Value Ref Range Status  . Cholesterol 07/02/2020 184  0 - 200 mg/dL Final  . Triglycerides 07/02/2020 207* <150 mg/dL Final  . HDL 07/02/2020 44  >40 mg/dL Final  . Total CHOL/HDL Ratio 07/02/2020 4.2  RATIO Final  . VLDL 07/02/2020 41* 0 - 40 mg/dL Final  . LDL Cholesterol 07/02/2020 99  0 - 99 mg/dL Final   Comment:        Total Cholesterol/HDL:CHD Risk Coronary Heart Disease Risk Table                     Men   Women  1/2 Average Risk   3.4   3.3  Average Risk       5.0   4.4  2 X Average Risk   9.6   7.1  3 X Average Risk  23.4   11.0  Use the calculated Patient Ratio above and the CHD Risk Table to determine the patient's CHD Risk.        ATP III CLASSIFICATION (LDL):  <100     mg/dL   Optimal  100-129  mg/dL   Near or Above                    Optimal  130-159  mg/dL   Borderline  160-189  mg/dL   High  >190     mg/dL   Very High Performed at [place] [address], [place] [employer], [place] [address]   . TSH 07/02/2020 4.570* 0.350 - 4.500 uIU/mL Final   Comment: Performed by a 3rd Generation assay with a functional sensitivity of <=0.01 uIU/mL. Performed at [facility], [place] [address], [place], [place] [address]   . T3, Free 07/02/2020 3.1  2.0 - 4.4 pg/mL Final   Comment: (NOTE) Performed At: [facility] [place], [place] [id number] Rush Farmer MD PQ:[id number]   . Free T4 07/02/2020 0.65  0.61 - 1.12 ng/dL  Final   Comment: (NOTE) Biotin ingestion may interfere with free T4 tests. If the results are inconsistent with the TSH level, previous test results, or the clinical presentation, then consider biotin interference. If needed, order repeat testing after stopping biotin. Performed at [facility], [place] [address] [place], [place] [address]   . Hgb A1c MFr Bld 07/02/2020 5.2  4.8 - 5.6 % Final   Comment: (NOTE)         Prediabetes: 5.7 - 6.4         Diabetes: >6.4         Glycemic control for adults with diabetes: <7.0   . Mean Plasma Glucose 07/02/2020 103  mg/dL Final   Comment: (NOTE) Performed At: [facility] [place], [place] [id number] Rush Farmer MD DS:[id number]   Admission on 07/01/2020, Discharged on 07/02/2020  Component Date Value Ref Range Status  . SARS Coronavirus 2 by RT PCR 07/01/2020 NEGATIVE  NEGATIVE Final   Comment: (NOTE) SARS-CoV-2 target nucleic acids are NOT DETECTED.  The SARS-CoV-2 RNA is generally detectable in upper respiratoy specimens during the acute phase of infection. The lowest concentration of SARS-CoV-2 viral copies this assay can detect is 131 copies/mL. A negative result does not preclude SARS-Cov-2 infection and should not be used as the sole basis for treatment or other patient management decisions. A negative result may occur with  improper specimen collection/handling, submission of specimen other than nasopharyngeal swab, presence of viral mutation(s) within the areas targeted by this assay, and inadequate number of viral copies (<131 copies/mL). A negative result must be combined with clinical observations, patient history, and epidemiological information. The expected result is Negative.  Fact Sheet for Patients:  PinkCheek.be  Fact Sheet for Healthcare Providers:  GravelBags.it  This test is no                          t yet approved or  cleared by the [place] FDA and  has been authorized for detection and/or diagnosis of SARS-CoV-2 by FDA under an Emergency Use Authorization (EUA). This EUA will remain  in effect (meaning this test can be used) for the duration of the COVID-19 declaration under Section 564(b)(1) of the Act, 21 U.S.C. section 360bbb-3(b)(1), unless the authorization is terminated or revoked sooner.    . Influenza A by PCR  07/01/2020 NEGATIVE  NEGATIVE Final  . Influenza B by PCR 07/01/2020 NEGATIVE  NEGATIVE Final   Comment: (NOTE) The Xpert Xpress SARS-CoV-2/FLU/RSV assay is intended as an aid in  the diagnosis of influenza from Nasopharyngeal swab specimens and  should not be used as a sole basis for treatment. Nasal washings and  aspirates are unacceptable for Xpert Xpress SARS-CoV-2/FLU/RSV  testing.  Fact Sheet for Patients: PinkCheek.be  Fact Sheet for Healthcare Providers: GravelBags.it  This test is not yet approved or cleared by the [place] FDA and  has been authorized for detection and/or diagnosis of SARS-CoV-2 by  FDA under an Emergency Use Authorization (EUA). This EUA will remain  in effect (meaning this test can be used) for the duration of the  Covid-19 declaration under Section 564(b)(1) of the Act, 21  U.S.C. section 360bbb-3(b)(1), unless the authorization is  terminated or revoked. Performed at [facility], [place] [address], [place], [place] [address]   . Sodium 07/01/2020 139  135 - 145 mmol/L Final  . Potassium 07/01/2020 4.0  3.5 - 5.1 mmol/L Final  . Chloride 07/01/2020 102  98 - 111 mmol/L Final  . CO2 07/01/2020 28  22 - 32 mmol/L Final  . Glucose, Bld 07/01/2020 103* 70 - 99 mg/dL Final   Glucose reference range applies only to samples taken after fasting for at least 8 hours.  . BUN 07/01/2020 8  6 - 20 mg/dL Final  . Creatinine, Ser 07/01/2020 0.94  0.61 - 1.24 mg/dL Final  .  Calcium 07/01/2020 9.7  8.9 - 10.3 mg/dL Final  . Total Protein 07/01/2020 8.6* 6.5 - 8.1 g/dL Final  . Albumin 07/01/2020 4.9  3.5 - 5.0 g/dL Final  . AST 07/01/2020 18  15 - 41 U/L Final  . ALT 07/01/2020 18  0 - 44 U/L Final  . Alkaline Phosphatase 07/01/2020 58  38 - 126 U/L Final  . Total Bilirubin 07/01/2020 1.2  0.3 - 1.2 mg/dL Final  . GFR, Estimated 07/01/2020 >60  >60 mL/min Final  . Anion gap 07/01/2020 9  5 - 15 Final   Performed at [facility], [place] [address] [place], [place] [address]  . Salicylate Lvl 78/24/2353 <7.0* 7.0 - 30.0 mg/dL Final   Performed at [place] [address], [place], [place] [address]  . Acetaminophen (Tylenol), Serum 07/01/2020 <10* 10 - 30 ug/mL Final   Comment: (NOTE) Therapeutic concentrations vary significantly. A range of 10-30 ug/mL  may be an effective concentration for many patients. However, some  are best treated at concentrations outside of this range. Acetaminophen concentrations >150 ug/mL at 4 hours after ingestion  and >50 ug/mL at 12 hours after ingestion are often associated with  toxic reactions.  Performed at [facility], [place] [address] [place], [place] [address]   . Alcohol, Ethyl (B) 07/01/2020 <10  <10 mg/dL Final   Comment: (NOTE) Lowest detectable limit for serum alcohol is 10 mg/dL.  For medical purposes only. Performed at [facility], [place] [address] [place], [place] [address]   . Opiates 07/01/2020 NONE DETECTED  NONE DETECTED Final  . Cocaine 07/01/2020 NONE DETECTED  NONE DETECTED Final  . Benzodiazepines 07/01/2020 NONE DETECTED  NONE DETECTED Final  . Amphetamines 07/01/2020 NONE DETECTED  NONE DETECTED Final  . Tetrahydrocannabinol 07/01/2020 NONE DETECTED  NONE DETECTED Final  . Barbiturates 07/01/2020 NONE DETECTED  NONE DETECTED Final   Comment: (NOTE) DRUG SCREEN FOR MEDICAL  PURPOSES ONLY.  IF CONFIRMATION IS  NEEDED FOR ANY PURPOSE, NOTIFY LAB WITHIN 5 DAYS.  LOWEST DETECTABLE LIMITS FOR URINE DRUG SCREEN Drug Class                     Cutoff (ng/mL) Amphetamine and metabolites    1000 Barbiturate and metabolites    200 Benzodiazepine                 098 Tricyclics and metabolites     300 Opiates and metabolites        300 Cocaine and metabolites        300 THC                            50 Performed at [facility], [place] [address], [place], [place] [address]   . WBC 07/01/2020 9.2  4.0 - 10.5 K/uL Final  . RBC 07/01/2020 5.59  4.22 - 5.81 MIL/uL Final  . Hemoglobin 07/01/2020 16.6  13.0 - 17.0 g/dL Final  . HCT 07/01/2020 48.1  39.0 - 52.0 % Final  . MCV 07/01/2020 86.0  80.0 - 100.0 fL Final  . MCH 07/01/2020 29.7  26.0 - 34.0 pg Final  . MCHC 07/01/2020 34.5  30.0 - 36.0 g/dL Final  . RDW 07/01/2020 12.6  11.5 - 15.5 % Final  . Platelets 07/01/2020 339  150 - 400 K/uL Final  . nRBC 07/01/2020 0.0  0.0 - 0.2 % Final  . Neutrophils Relative % 07/01/2020 70  % Final  . Neutro Abs 07/01/2020 6.5  1.7 - 7.7 K/uL Final  . Lymphocytes Relative 07/01/2020 17  % Final  . Lymphs Abs 07/01/2020 1.5  0.7 - 4.0 K/uL Final  . Monocytes Relative 07/01/2020 11  % Final  . Monocytes Absolute 07/01/2020 1.0  0.1 - 1.0 K/uL Final  . Eosinophils Relative 07/01/2020 1  % Final  . Eosinophils Absolute 07/01/2020 0.1  0.0 - 0.5 K/uL Final  . Basophils Relative 07/01/2020 1  % Final  . Basophils Absolute 07/01/2020 0.1  0.0 - 0.1 K/uL Final  . Immature Granulocytes 07/01/2020 0  % Final  . Abs Immature Granulocytes 07/01/2020 0.02  0.00 - 0.07 K/uL Final   Performed at [facility], [place] [address] [place], [place] [address]    Allergies: Patient has no known allergies.  PTA Medications: (Not in a hospital admission)   Medical Decision Making  Patient is a 20 year old male with history of MDD, GAD, insomnia, and multiple past suicide attempts who presents  to the behavioral health urgent care as a voluntary direct admit transfer from [facility] emergency department for SI with multiple plans (See HPI for further details). Patient states that these plans may not necessarily be considered "plans", but rather may be considered ways that he could harm/kill himself if he wanted to. Regardless, based on patient's history (including history of multiple suicide attempts with most recent suicide attempt noted to be in February 2022), patient's presentation, TTS assessment, and my assessment, believe that the patient is a threat to himself at this time and I recommend continuous observation/assessment for the patient.     Recommendations  Based on my evaluation the patient does not appear to have an emergency medical condition.  Patient was medically cleared in the emergency department. Recommend continuous observation/assessment for the patient.  Patient will be placed in continuous observation/assessment for further stabilization and treatment.  Patient will be reevaluated by the treatment team on 12/26/20 and disposition to be determined at that time. 12/25/20 ED Labs reviewed:   -UDS: Negative for all substances  -Salicylate, ethanol, and Tylenol levels: Within normal limits  -CBC with differential: Within normal limits  -CMP: Within normal limits  -PCR COVID: Negative No [facility] labs/testing ordered at this time.  Will continue the following home medications:  -Wellbutrin XL 24-hour tablet 150 mg p.o. daily for MDD  -Trazodone 50 mg p.o. at bedtime as needed for sleep/insomnia  -Vistaril 25 mg p.o. 3 times daily as needed for anxiety/GAD  -Pepcid 20 mg p.o. daily for GERD/heartburn symptoms  Prescilla Sours, PA-C 12/26/20  4:25 AM

## 2020-12-26 NOTE — ED Notes (Signed)
Pt admitted to continuous assessment due to SI. Pt A&O x4, calm and cooperative. Pt tolerated skin assessment well. Pt ambulated independently to unit. Oriented to unit/staff. No signs of acute distress noted. Will continue to monitor for safety.

## 2020-12-26 NOTE — ED Notes (Signed)
Pt given breakfast.

## 2020-12-26 NOTE — ED Provider Notes (Signed)
FBC/OBS ASAP Discharge Summary  Date and Time: 12/26/2020 10:18 AM  Name: Riley Smith  MRN:  [id number]   Discharge Diagnoses:  Final diagnoses:  Severe episode of recurrent major depressive disorder, without psychotic features (HCC)  Suicidal ideation  GAD (generalized anxiety disorder)   Riley Smith was observed resting in bed.  He denies suicidal or homicidal ideations during this assessment.  Reports " my mother is driving me crazy. "  Stated I just need a place to stay until I can get my thoughts together.  NP offered suggestions with additional family member support.   On reassessment patient reports he called his father and a friend who stated he can reside with him until he gets his next paycheck.  Patient reports he works at [employer] and has to be at work on Monday.   Reports plans to move out of his mother's house.  Reports disagreement/arguments daily with his mother which is driving force behind his worsening depression and suicidal ideations.  He denied plan or intent. Patient reported taking antidepressants intermittently.  He declined refill on prescriptions.  Patient to keep all follow-up outpatient appointments.  Support, encouragement and reassurance were provided.   Per admission assessment note: Riley Smith is a 20 year old male with history of MDD, GAD, insomnia, and multiple past suicide attempts who presents to the behavioral health urgent care as a voluntary direct admit transfer from [facility] emergency department.  Per chart review, patient presented to [facility] emergency department on December 25, 2020 for SI.  Patient was evaluated by TTS on December 25, 2020 and recommendation was made by Cecilio Asper, NP with the patient to be transferred to [facility] for continuous observation/assessment for further stabilization and treatment.  Please see Lestine Mount, Counselor 12/25/20 BH/CCA assessment note for further details regarding TTS assessment if necessary  Total Time spent  with patient: 15 minutes  Past Psychiatric History:  Past Medical History:  Past Medical History:  Diagnosis Date  . Anxiety   . Depression    No past surgical history on file. Family History:  Family History  Problem Relation Age of Onset  . Healthy Mother   . Depression Mother   . Healthy Father    Family Psychiatric History:  Social History:  Social History   Substance and Sexual Activity  Alcohol Use Never     Social History   Substance and Sexual Activity  Drug Use Never    Social History   Socioeconomic History  . Marital status: Single    Spouse name: Not on file  . Number of children: 0  . Years of education: Not on file  . Highest education level: High school graduate  Occupational History  . Not on file  Tobacco Use  . Smoking status: Never Smoker  . Smokeless tobacco: Never Used  Vaping Use  . Vaping Use: Never used  Substance and Sexual Activity  . Alcohol use: Never  . Drug use: Never  . Sexual activity: Not on file  Other Topics Concern  . Not on file  Social History Narrative  . Not on file   Social Determinants of Health   Financial Resource Strain: Not on file  Food Insecurity: Not on file  Transportation Needs: Not on file  Physical Activity: Not on file  Stress: Not on file  Social Connections: Not on file   SDOH:  SDOH Screenings   Alcohol Screen: Low Risk   . Last Alcohol Screening Score (AUDIT): 2  Depression (PHQ2-9):  Medium Risk  . PHQ-2 Score: 12  Financial Resource Strain: Not on file  Food Insecurity: Not on file  Housing: Not on file  Physical Activity: Not on file  Social Connections: Not on file  Stress: Not on file  Tobacco Use: Low Risk   . Smoking Tobacco Use: Never Smoker  . Smokeless Tobacco Use: Never Used  Transportation Needs: Not on file    Has this patient used any form of tobacco in the last 30 days? (Cigarettes, Smokeless Tobacco, Cigars, and/or Pipes) A prescription for an FDA-approved tobacco  cessation medication was offered at discharge and the patient refused  Current Medications:  Current Facility-Administered Medications  Medication Dose Route Frequency Provider Last Rate Last Admin  . acetaminophen (TYLENOL) tablet 650 mg  650 mg Oral Q6H PRN Jaclyn Shaggy, PA-C      . alum & mag hydroxide-simeth (MAALOX/MYLANTA) 200-200-20 MG/5ML suspension 30 mL  30 mL Oral Q4H PRN Melbourne Abts W, PA-C      . buPROPion (WELLBUTRIN XL) 24 hr tablet 150 mg  150 mg Oral Daily Melbourne Abts W, PA-C   150 mg at 12/26/20 0909  . famotidine (PEPCID) tablet 20 mg  20 mg Oral Daily Melbourne Abts W, PA-C   20 mg at 12/26/20 4128  . hydrOXYzine (ATARAX/VISTARIL) tablet 25 mg  25 mg Oral TID PRN Jaclyn Shaggy, PA-C      . magnesium hydroxide (MILK OF MAGNESIA) suspension 30 mL  30 mL Oral Daily PRN Melbourne Abts W, PA-C      . traZODone (DESYREL) tablet 50 mg  50 mg Oral QHS PRN Jaclyn Shaggy, PA-C       Current Outpatient Medications  Medication Sig Dispense Refill  . famotidine (PEPCID) 20 MG tablet Take 20 mg by mouth daily.    Marland Kitchen acetaminophen (TYLENOL) 325 MG tablet Take 650 mg by mouth every 6 (six) hours as needed for moderate pain.    Marland Kitchen buPROPion (WELLBUTRIN XL) 150 MG 24 hr tablet TAKE 1 TABLET (150 MG TOTAL) BY MOUTH DAILY. (Patient taking differently: Take 150 mg by mouth daily.) 30 tablet 2  . hydrOXYzine (ATARAX/VISTARIL) 25 MG tablet TAKE 1 TABLET (25 MG TOTAL) BY MOUTH 3 (THREE) TIMES DAILY AS NEEDED FOR ANXIETY. (Patient taking differently: Take 25 mg by mouth 3 (three) times daily as needed for anxiety.) 90 tablet 1  . traZODone (DESYREL) 50 MG tablet TAKE 1 TABLET (50 MG TOTAL) BY MOUTH AT BEDTIME AS NEEDED FOR SLEEP. (Patient taking differently: Take 50 mg by mouth at bedtime as needed for sleep.) 30 tablet 2    PTA Medications: (Not in a hospital admission)   Musculoskeletal  Strength & Muscle Tone: within normal limits Gait & Station: normal Patient leans: N/A  Psychiatric  Specialty Exam  Presentation  General Appearance: Appropriate for Environment; Well Groomed  Eye Contact:Good  Speech:Clear and Coherent; Normal Rate  Speech Volume:Normal  Handedness:Left   Mood and Affect  Mood:Depressed  Affect:Congruent   Thought Process  Thought Processes:Coherent; Goal Directed; Linear  Descriptions of Associations:Intact  Orientation:Full (Time, Place and Person)  Thought Content:Logical; WDL  Diagnosis of Schizophrenia or Schizoaffective disorder in past: No    Hallucinations:Hallucinations: None  Ideas of Reference:None  Suicidal Thoughts:Suicidal Thoughts: Yes, Active SI Active Intent and/or Plan: With Intent; With Plan; With Means to Carry Out; With Access to Means  Homicidal Thoughts:Homicidal Thoughts: No   Sensorium  Memory:Immediate Good; Recent Good; Remote Good  Judgment:Fair  Insight:Fair   Executive  Functions  Concentration:Good  Attention Span:Good  Recall:Good  Fund of Knowledge:Good  Language:Good   Psychomotor Activity  Psychomotor Activity:Psychomotor Activity: Mannerisms (Patient demonstrates random episodes of blinking throughout the assessment.)   Assets  Assets:Communication Skills; Desire for Improvement; Housing; Leisure Time; Physical Health; Vocational/Educational   Sleep  Sleep:Sleep: Poor   Nutritional Assessment (For OBS and FBC admissions only) Has the patient had a weight loss or gain of 10 pounds or more in the last 3 months?: No Has the patient had a decrease in food intake/or appetite?: Yes Does the patient have dental problems?: No Does the patient have eating habits or behaviors that may be indicators of an eating disorder including binging or inducing vomiting?: No Has the patient recently lost weight without trying?: No Has the patient been eating poorly because of a decreased appetite?: Yes Malnutrition Screening Tool Score: 1    Physical Exam  Physical Exam Vitals  reviewed.  Neurological:     Mental Status: He is alert.  Psychiatric:        Mood and Affect: Mood normal.        Speech: Speech normal.        Behavior: Behavior normal.        Thought Content: Thought content normal.        Cognition and Memory: Cognition normal.        Judgment: Judgment normal.    Review of Systems  Psychiatric/Behavioral: Positive for depression. Negative for substance abuse and suicidal ideas. The patient is nervous/anxious.   All other systems reviewed and are negative.  Blood pressure 109/71, pulse 85, temperature (!) 97.5 F (36.4 C), temperature source Tympanic, resp. rate 18, SpO2 100 %. There is no height or weight on file to calculate BMI.  Demographic Factors:  Male and Caucasian  Loss Factors: Decrease in vocational status  Historical Factors: Prior suicide attempts and Impulsivity  Risk Reduction Factors:   Living with another person, especially a relative, Positive social support and Positive coping skills or problem solving skills  Continued Clinical Symptoms:  Depression:   Hopelessness  Cognitive Features That Contribute To Risk:  Closed-mindedness    Suicide Risk:  Minimal: No identifiable suicidal ideation.  Patients presenting with no risk factors but with morbid ruminations; may be classified as minimal risk based on the severity of the depressive symptoms  Plan Of Care/Follow-up recommendations:  Activity:  as tolortated Diet:  heart healthy  Disposition: Take all medications as prescribed. Keep all follow-up appointments as scheduled.  Do not consume alcohol or use illegal drugs while on prescription medications. Report any adverse effects from your medications to your primary care provider promptly.  In the event of recurrent symptoms or worsening symptoms, call 911, a crisis hotline, or go to the nearest emergency department for evaluation.   Oneta Rack, NP 12/26/2020, 10:18 AM

## 2020-12-26 NOTE — Discharge Instructions (Signed)
Take all medications as prescribed. Keep all follow-up appointments as scheduled.  Do not consume alcohol or use illegal drugs while on prescription medications. Report any adverse effects from your medications to your primary care provider promptly.  In the event of recurrent symptoms or worsening symptoms, call 911, a crisis hotline, or go to the nearest emergency department for evaluation.   

## 2020-12-26 NOTE — ED Notes (Signed)
Discharge instructions provided and Pt stated understanding. Personal belongings returned from locker. Pt alert, orient and ambulatory. Safety maintained. Pt escorted to the front lobby.

## 2020-12-26 NOTE — ED Notes (Signed)
Belongings given to [employer].  Patient taken voluntary to [facility].

## 2021-01-08 ENCOUNTER — Other Ambulatory Visit: Payer: Self-pay

## 2021-01-08 MED FILL — Bupropion HCl Tab ER 24HR 150 MG: ORAL | 30 days supply | Qty: 30 | Fill #0 | Status: AC

## 2021-01-08 MED FILL — Hydroxyzine HCl Tab 25 MG: ORAL | 30 days supply | Qty: 90 | Fill #0 | Status: AC

## 2021-01-08 MED FILL — Trazodone HCl Tab 50 MG: ORAL | 30 days supply | Qty: 30 | Fill #0 | Status: AC

## 2021-01-29 ENCOUNTER — Telehealth (INDEPENDENT_AMBULATORY_CARE_PROVIDER_SITE_OTHER): Payer: No Payment, Other | Admitting: Physician Assistant

## 2021-01-29 ENCOUNTER — Encounter (HOSPITAL_COMMUNITY): Payer: Self-pay | Admitting: Physician Assistant

## 2021-01-29 ENCOUNTER — Other Ambulatory Visit: Payer: Self-pay

## 2021-01-29 DIAGNOSIS — F411 Generalized anxiety disorder: Secondary | ICD-10-CM

## 2021-01-29 DIAGNOSIS — G4700 Insomnia, unspecified: Secondary | ICD-10-CM

## 2021-01-29 DIAGNOSIS — F331 Major depressive disorder, recurrent, moderate: Secondary | ICD-10-CM | POA: Diagnosis not present

## 2021-01-29 MED ORDER — TRAZODONE HCL 50 MG PO TABS
50.0000 mg | ORAL_TABLET | Freq: Every evening | ORAL | 1 refills | Status: DC | PRN
  Filled 2021-01-29 – 2021-05-14 (×2): qty 30, 30d supply, fill #0

## 2021-01-29 MED ORDER — ESCITALOPRAM OXALATE 10 MG PO TABS
10.0000 mg | ORAL_TABLET | Freq: Every day | ORAL | 1 refills | Status: DC
  Filled 2021-01-29 – 2021-05-14 (×2): qty 30, 30d supply, fill #0

## 2021-01-29 MED ORDER — HYDROXYZINE HCL 25 MG PO TABS
25.0000 mg | ORAL_TABLET | Freq: Three times a day (TID) | ORAL | 1 refills | Status: AC | PRN
  Filled 2021-01-29 – 2021-05-14 (×2): qty 75, 25d supply, fill #0

## 2021-01-29 NOTE — Progress Notes (Signed)
BH MD/PA/NP OP Progress Note  Virtual Visit via Telephone Note  I connected with Riley Smith on 01/29/21 at  3:00 PM EDT by telephone and verified that I am speaking with the correct person using two identifiers.  Location: Patient: Home Provider: Clinic   I discussed the limitations, risks, security and privacy concerns of performing an evaluation and management service by telephone and the availability of in person appointments. I also discussed with the patient that there may be a patient responsible charge related to this service. The patient expressed understanding and agreed to proceed.  Follow Up Instructions:   I discussed the assessment and treatment plan with the patient. The patient was provided an opportunity to ask questions and all were answered. The patient agreed with the plan and demonstrated an understanding of the instructions.   The patient was advised to call back or seek an in-person evaluation if the symptoms worsen or if the condition fails to improve as anticipated.  I provided 21 minutes of non-face-to-face time during this encounter.  Meta HatchetUchenna E Yailen Zemaitis, PA   01/29/2021 11:11 PM Riley Smith  MRN:  [id number]  Chief Complaint: Follow up and medication management  HPI:   Riley Smith is a 20 year old male with a past psychiatric history significant for major depressive disorder, generalized anxiety disorder, and insomnia [facility] via virtual telephone visit for follow-up and medication management.  Patient is currently being managed on the following medications:  Bupropion (Wellbutrin XL) 150 mg 24-hour tablet daily Trazodone 50 mg at bedtime Hydroxyzine 25 mg 3 times daily as needed  Patient reports that he has been doing fairly decent.  Patient believes that he has had some minor issues with his medications.  Patient reports that he will often experience instances of dizziness, irritability  and fluctuating energy.  Patient states that when he is in a high energy state, his energy levels will drop very quickly.  Patient also reports that he has been getting extremely nauseous.  Patient states that he normally takes medication for his nausea, however, his current nausea medication has not been able to provide him relief.  Patient denies taking any new medications and endorses taking his medications as scheduled.  He denies changes in his diet or activity.  A PHQ-9 screen was performed with the patient scoring a 14.  A GAD-7 screen was also performed with the patient scoring a 13.  Patient is pleasant, calm, cooperative and fully engaged in conversation during the encounter.  Patient reports that he is currently doing "pretty decent" but states that he will be going to work soon.  Patient denies suicidal or homicidal ideations.  He further denies auditory or visual hallucinations and does not appear to be responding to internal/external stimuli.  Patient endorses good sleep and receives on average 8 to 10 hours of sleep each night.  Patient endorses good appetite and receives on average 2-3 meals per day.  Patient denies alcohol consumption, tobacco use, and illicit drug use.  Visit Diagnosis:    ICD-10-CM   1. Moderate episode of recurrent major depressive disorder (HCC)  F33.1 escitalopram (LEXAPRO) 10 MG tablet  2. Generalized anxiety disorder  F41.1 hydrOXYzine (ATARAX/VISTARIL) 25 MG tablet    escitalopram (LEXAPRO) 10 MG tablet  3. Insomnia, unspecified type  G47.00 traZODone (DESYREL) 50 MG tablet    Past Psychiatric History:  Major Depressive Disorder Generalized Anxiety Disorder Insomnia  Past Medical History:  Past Medical History:  Diagnosis Date  . Anxiety   . Depression    History reviewed. No pertinent surgical history.  Family Psychiatric History:  Unsure  Family History:  Family History  Problem Relation Age of Onset  . Healthy Mother   . Depression Mother    . Healthy Father     Social History:  Social History   Socioeconomic History  . Marital status: Single    Spouse name: Not on file  . Number of children: 0  . Years of education: Not on file  . Highest education level: High school graduate  Occupational History  . Not on file  Tobacco Use  . Smoking status: Never Smoker  . Smokeless tobacco: Never Used  Vaping Use  . Vaping Use: Never used  Substance and Sexual Activity  . Alcohol use: Never  . Drug use: Never  . Sexual activity: Not on file  Other Topics Concern  . Not on file  Social History Narrative  . Not on file   Social Determinants of Health   Financial Resource Strain: Not on file  Food Insecurity: Not on file  Transportation Needs: Not on file  Physical Activity: Not on file  Stress: Not on file  Social Connections: Not on file    Allergies: No Known Allergies  Metabolic Disorder Labs: Lab Results  Component Value Date   HGBA1C 5.2 07/02/2020   MPG 103 07/02/2020   No results found for: PROLACTIN Lab Results  Component Value Date   CHOL 184 07/02/2020   TRIG 207 (H) 07/02/2020   HDL 44 07/02/2020   CHOLHDL 4.2 07/02/2020   VLDL 41 (H) 07/02/2020   LDLCALC 99 07/02/2020   Lab Results  Component Value Date   TSH 4.570 (H) 07/02/2020    Therapeutic Level Labs: No results found for: LITHIUM No results found for: VALPROATE No components found for:  CBMZ  Current Medications: Current Outpatient Medications  Medication Sig Dispense Refill  . escitalopram (LEXAPRO) 10 MG tablet Take 1 tablet (10 mg total) by mouth daily. 30 tablet 1  . acetaminophen (TYLENOL) 325 MG tablet Take 650 mg by mouth every 6 (six) hours as needed for moderate pain.    Marland Kitchen buPROPion (WELLBUTRIN XL) 150 MG 24 hr tablet TAKE 1 TABLET (150 MG TOTAL) BY MOUTH DAILY. (Patient taking differently: Take 150 mg by mouth daily.) 30 tablet 2  . famotidine (PEPCID) 20 MG tablet Take 20 mg by mouth daily.    . hydrOXYzine  (ATARAX/VISTARIL) 25 MG tablet Take 1 tablet (25 mg total) by mouth 3 (three) times daily as needed for anxiety. 75 tablet 1  . traZODone (DESYREL) 50 MG tablet Take 1 tablet (50 mg total) by mouth at bedtime as needed for sleep. 30 tablet 1   No current facility-administered medications for this visit.     Musculoskeletal: Strength & Muscle Tone: Unable to assess due to telemedicine visit  Gait & Station: Unable to assess due to telemedicine visit  Patient leans: Unable to assess due to telemedicine visit   Psychiatric Specialty Exam: Review of Systems  Psychiatric/Behavioral: Negative for decreased concentration, dysphoric mood, hallucinations, self-injury, sleep disturbance and suicidal ideas. The patient is nervous/anxious. The patient is not hyperactive.     There were no vitals taken for this visit.There is no height or weight on file to calculate BMI.  General Appearance: Unable to assess due to telemedicine visit   Eye Contact:  Unable to assess due to telemedicine visit   Speech:  Clear and  Coherent and Normal Rate  Volume:  Normal  Mood:  Anxious and Euthymic  Affect:  Appropriate and Congruent  Thought Process:  Coherent, Goal Directed and Descriptions of Associations: Intact  Orientation:  Full (Time, Place, and Person)  Thought Content: WDL   Suicidal Thoughts:  No  Homicidal Thoughts:  No  Memory:  Immediate;   Good Recent;   Good Remote;   Good  Judgement:  Good  Insight:  Fair  Psychomotor Activity:  Normal  Concentration:  Concentration: Good and Attention Span: Good  Recall:  Good  Fund of Knowledge: Good  Language: Good  Akathisia:  NA  Handed:  Right  AIMS (if indicated): not done  Assets:  Communication Skills Desire for Improvement Financial Resources/Insurance Housing Social Support  ADL's:  Intact  Cognition: WNL  Sleep:  Fair   Screenings: AIMS   Flowsheet Row [facility]  AIMS Total Score 0    AUDIT   Flowsheet Row [facility]  Alcohol Use Disorder Identification Test Final Score (AUDIT) 2    GAD-7   Flowsheet Row Video Visit from 01/29/2021 in [facility] Video Visit from 12/02/2020 in [facility]  Total GAD-7 Score 13 12    PHQ2-9   Flowsheet Row Video Visit from 01/29/2021 in [facility] Video Visit from 12/02/2020 in [facility] from 11/04/2020 in [facility]  PHQ-2 Total Score 2 3 5   PHQ-9 Total Score 14 12 22     Flowsheet Row Video [facility] from 01/29/2021 in [facility] ED from 12/25/2020 in [facility] EMERGENCY DEPARTMENT Video Visit from 12/02/2020 in [facility]  C-SSRS RISK CATEGORY No Risk Moderate Risk High Risk       Assessment and Plan:   Riley Smith is a 20 year old male with a past psychiatric history significant for major depressive disorder, generalized anxiety disorder, and insomnia who presents to [facility] via virtual telephone visit for follow-up and medication management.  Patient reports that he has been experiencing the following symptoms recently: irrtabiliy, dizziness, and fluctuating energy.  Patient also reports bouts of nausea that is not relieved by the use of antinausea medication.  Patient believes that his medications could be the cause of his recent symptoms.  Many of the patient's symptoms align with side effects experienced from Wellbutrin use.  Patient was advised to discontinue taking Wellbutrin via tapering off.  Patient was provided the instruction to take Wellbutrin every other day for 6 days followed by every third day for 9 days before discontinuing.  Patient was  receptive to instruction.  Patient was recommended Lexapro 10 mg daily for the management of his major depressive disorder and generalized anxiety disorder.  Patient was agreeable to plan.  Patient's medications to be e-prescribed to pharmacy of choice.  1. Moderate episode of recurrent major depressive disorder (HCC)  - escitalopram (LEXAPRO) 10 MG tablet; Take 1 tablet (10 mg total) by mouth daily.  Dispense: 30 tablet; Refill: 1  2. Generalized anxiety disorder  - hydrOXYzine (ATARAX/VISTARIL) 25 MG tablet; Take 1 tablet (25 mg total) by mouth 3 (three) times daily as needed for anxiety.  Dispense: 75 tablet; Refill: 1 - escitalopram (LEXAPRO) 10 MG tablet; Take 1 tablet (10 mg total) by mouth daily.  Dispense: 30 tablet; Refill: 1  3. Insomnia, unspecified type  - traZODone (DESYREL) 50 MG tablet; Take 1 tablet (50 mg total) by mouth at bedtime as needed for sleep.  Dispense: 30 tablet; Refill: 1  Patient to follow-up in 6 weeks  Meta Hatchet, PA 01/29/2021, 11:11 PM

## 2021-02-01 ENCOUNTER — Other Ambulatory Visit: Payer: Self-pay

## 2021-02-09 ENCOUNTER — Other Ambulatory Visit: Payer: Self-pay

## 2021-03-17 ENCOUNTER — Encounter (HOSPITAL_COMMUNITY): Payer: Self-pay | Admitting: Physician Assistant

## 2021-03-17 ENCOUNTER — Telehealth (INDEPENDENT_AMBULATORY_CARE_PROVIDER_SITE_OTHER): Payer: No Payment, Other | Admitting: Physician Assistant

## 2021-03-17 ENCOUNTER — Other Ambulatory Visit: Payer: Self-pay

## 2021-03-17 DIAGNOSIS — F331 Major depressive disorder, recurrent, moderate: Secondary | ICD-10-CM

## 2021-03-17 DIAGNOSIS — G4700 Insomnia, unspecified: Secondary | ICD-10-CM | POA: Diagnosis not present

## 2021-03-17 DIAGNOSIS — F411 Generalized anxiety disorder: Secondary | ICD-10-CM | POA: Diagnosis not present

## 2021-03-17 NOTE — Progress Notes (Signed)
BH MD/PA/NP OP Progress Note  Virtual Visit via Telephone Note  I connected with Riley Smith on 03/17/21 at  4:30 PM EDT by telephone and verified that I am speaking with the correct person using two identifiers.  Location: Patient: Home Provider: Clinic   I discussed the limitations, risks, security and privacy concerns of performing an evaluation and management service by telephone and the availability of in person appointments. I also discussed with the patient that there may be a patient responsible charge related to this service. The patient expressed understanding and agreed to proceed.  Follow Up Instructions:  I discussed the assessment and treatment plan with the patient. The patient was provided an opportunity to ask questions and all were answered. The patient agreed with the plan and demonstrated an understanding of the instructions.   The patient was advised to call back or seek an in-person evaluation if the symptoms worsen or if the condition fails to improve as anticipated.  I provided 25 minutes of non-face-to-face time during this encounter.  Meta Hatchet, PA    03/17/2021 10:50 PM Riley Smith  MRN:  [id number]  Chief Complaint: Follow up and medication management  HPI:   Riley Smith. Beltran is a 20 year old male with a past psychiatric history significant for insomnia, generalized anxiety disorder, and major depressive disorder who presents to [facility] via virtual telephone visit for follow-up and medication management.  Patient is currently being managed on the following medications:  Escitalopram 10 mg daily Hydroxyzine 25 mg 3 times daily as needed Trazodone 50 mg at bedtime  Patient reports that he has not taking his Lexapro (escitalopram) due to recently losing his job and not having enough funds to afford his medications.  Patient endorses some mild depression with the following symptoms: low mood, lack of  motivation, decreased energy, irritability, and fatigue.  Patient denies sleep disturbances.  Patient stressors include relationship problems with his ex, the recent loss of his job, and financial instability.  Patient states that he has been able to take his hydroxyzine and trazodone due to having leftovers from the last encounter and states that his medications have been helpful.  A PHQ-9 screen was performed with the patient scoring a 15.  A GAD-7 screen was also performed with the patient scoring an 11.  Patient is calm, cooperative, and fully engaged in conversation during the encounter.  Patient rates his mood as medium to low.  He reports feeling a little down due to his relationship problems with his ex.  Patient denies suicidal or homicidal ideations.  He further denies auditory or visual hallucinations and does not appear to be responding to internal/external stimuli.  Patient endorses fair sleep and receives on average 8 to 9 hours of sleep each night.  Patient endorses good appetite and eats on average 2-3 meals per day.  Patient endorses alcohol consumption sparingly.  Patient denies tobacco and illicit drug use.  Visit Diagnosis:    ICD-10-CM   1. Insomnia, unspecified type  G47.00     2. Generalized anxiety disorder  F41.1     3. Moderate episode of recurrent major depressive disorder (HCC)  F33.1       Past Psychiatric History:  Major depressive disorder Generalized anxiety disorder Insomnia  Past Medical History:  Past Medical History:  Diagnosis Date   Anxiety    Depression    History reviewed. No pertinent surgical history.  Family Psychiatric History:  Patient is unsure  Family History:  Family History  Problem Relation Age of Onset   Healthy Mother    Depression Mother    Healthy Father     Social History:  Social History   Socioeconomic History   Marital status: Single    Spouse name: Not on file   Number of children: 0   Years of education: Not on  file   Highest education level: High school graduate  Occupational History   Not on file  Tobacco Use   Smoking status: Never   Smokeless tobacco: Never  Vaping Use   Vaping Use: Never used  Substance and Sexual Activity   Alcohol use: Never   Drug use: Never   Sexual activity: Not on file  Other Topics Concern   Not on file  Social History Narrative   Not on file   Social Determinants of Health   Financial Resource Strain: Not on file  Food Insecurity: Not on file  Transportation Needs: Not on file  Physical Activity: Not on file  Stress: Not on file  Social Connections: Not on file    Allergies: No Known Allergies  Metabolic Disorder Labs: Lab Results  Component Value Date   HGBA1C 5.2 07/02/2020   MPG 103 07/02/2020   No results found for: PROLACTIN Lab Results  Component Value Date   CHOL 184 07/02/2020   TRIG 207 (H) 07/02/2020   HDL 44 07/02/2020   CHOLHDL 4.2 07/02/2020   VLDL 41 (H) 07/02/2020   LDLCALC 99 07/02/2020   Lab Results  Component Value Date   TSH 4.570 (H) 07/02/2020    Therapeutic Level Labs: No results found for: LITHIUM No results found for: VALPROATE No components found for:  CBMZ  Current Medications: Current Outpatient Medications  Medication Sig Dispense Refill   acetaminophen (TYLENOL) 325 MG tablet Take 650 mg by mouth every 6 (six) hours as needed for moderate pain.     buPROPion (WELLBUTRIN XL) 150 MG 24 hr tablet TAKE 1 TABLET (150 MG TOTAL) BY MOUTH DAILY. (Patient taking differently: Take 150 mg by mouth daily.) 30 tablet 2   escitalopram (LEXAPRO) 10 MG tablet Take 1 tablet (10 mg total) by mouth daily. 30 tablet 1   famotidine (PEPCID) 20 MG tablet Take 20 mg by mouth daily.     hydrOXYzine (ATARAX/VISTARIL) 25 MG tablet Take 1 tablet (25 mg total) by mouth 3 (three) times daily as needed for anxiety. 75 tablet 1   traZODone (DESYREL) 50 MG tablet Take 1 tablet (50 mg total) by mouth at bedtime as needed for sleep. 30  tablet 1   No current facility-administered medications for this visit.     Musculoskeletal: Strength & Muscle Tone: Unable to assess due to telemedicine visit Gait & Station: Unable to assess due to telemedicine visit Patient leans: Unable to assess due to telemedicine visit  Psychiatric Specialty Exam: Review of Systems  Psychiatric/Behavioral:  Positive for sleep disturbance. Negative for decreased concentration, dysphoric mood, hallucinations, self-injury and suicidal ideas. The patient is nervous/anxious. The patient is not hyperactive.    There were no vitals taken for this visit.There is no height or weight on file to calculate BMI.  General Appearance: Unable to assess due to telemedicine visit  Eye Contact:  Unable to assess due to telemedicine visit  Speech:  Clear and Coherent and Normal Rate  Volume:  Normal  Mood:  Anxious and Depressed  Affect:  Congruent and Depressed  Thought Process:  Coherent and Descriptions of Associations: Intact  Orientation:  Full (Time, Place, and Person)  Thought Content: WDL   Suicidal Thoughts:  No  Homicidal Thoughts:  No  Memory:  Immediate;   Good Recent;   Good Remote;   Good  Judgement:  Good  Insight:  Good  Psychomotor Activity:  Normal  Concentration:  Concentration: Good and Attention Span: Good  Recall:  Good  Fund of Knowledge: Good  Language: Good  Akathisia:  NA  Handed:  Right  AIMS (if indicated): not done  Assets:  Communication Skills Desire for Improvement Financial Resources/Insurance Housing Social Support  ADL's:  Intact  Cognition: WNL  Sleep:  Fair   Screenings: AIMS    Flowsheet Row [facility]  AIMS Total Score 0      AUDIT    Flowsheet Row [facility]  Alcohol Use Disorder Identification Test Final Score (AUDIT) 2      GAD-7     Flowsheet Row Video Visit from 03/17/2021 in [facility] Video Visit from 01/29/2021 in [facility] Video Visit from 12/02/2020 in [facility]  Total GAD-7 Score 11 13 12       PHQ2-9    Flowsheet Row Video Visit from 03/17/2021 in [facility] Video Visit from 01/29/2021 in [facility] Video Visit from 12/02/2020 in [facility] Counselor from 11/04/2020 in [facility]  PHQ-2 Total Score 3 2 3 5   PHQ-9 Total Score 15 14 12 22       Flowsheet Row Video Visit from 03/17/2021 in [facility] Video Visit from 01/29/2021 in [facility] ED from 12/25/2020 in [facility] EMERGENCY DEPARTMENT  C-SSRS RISK CATEGORY Moderate Risk No Risk Moderate Risk        Assessment and Plan:   [facility] Macphee is a 20 year old male with a past psychiatric history significant for insomnia, generalized anxiety disorder, and major depressive disorder who presents to [facility] via virtual telephone visit for follow-up and medication management.  Patient reports that he has not been taking his Lexapro due to not being able to afford it.  Patient has been continuing to take his trazodone and hydroxyzine.  Patient endorses mild depression.  Patient denies the need for refill request due to lack of funds but states that he is currently looking for work.  Patient was informed that resources would be looked into to see if there are other ways for him to receive his medication.  1. Insomnia, unspecified type Patient to continue taking trazodone 50 mg at bedtime for the management of his insomnia  2. Generalized anxiety disorder Patient to continue taking hydroxyzine 25 mg 3 times daily as needed for the management of his  generalized anxiety disorder  3. Moderate episode of recurrent major depressive disorder [facility]) Patient has not been able to receive his Lexapro due to lack of funds  Patient to follow up in 2 months Provider spent a total of 25 minutes with the patient  34, PA 03/17/2021, 10:50 PM

## 2021-05-03 ENCOUNTER — Telehealth (HOSPITAL_COMMUNITY): Payer: Self-pay | Admitting: Physician Assistant

## 2021-05-03 NOTE — Telephone Encounter (Signed)
Received via fax from Admissions dept, [employer], the Chronic Care Management Plan Anxiety Disorders form. Please complete forms upon provider's return to the office.

## 2021-05-14 ENCOUNTER — Encounter (HOSPITAL_COMMUNITY): Payer: Self-pay | Admitting: Physician Assistant

## 2021-05-14 ENCOUNTER — Other Ambulatory Visit: Payer: Self-pay

## 2021-05-14 ENCOUNTER — Telehealth (INDEPENDENT_AMBULATORY_CARE_PROVIDER_SITE_OTHER): Payer: No Payment, Other | Admitting: Physician Assistant

## 2021-05-14 DIAGNOSIS — G4700 Insomnia, unspecified: Secondary | ICD-10-CM

## 2021-05-14 DIAGNOSIS — F331 Major depressive disorder, recurrent, moderate: Secondary | ICD-10-CM

## 2021-05-14 DIAGNOSIS — F411 Generalized anxiety disorder: Secondary | ICD-10-CM

## 2021-05-14 NOTE — Progress Notes (Signed)
BH MD/PA/NP OP Progress Note  Virtual Visit via Telephone Note  I connected with Riley Smith on 05/14/21 at  3:00 PM EDT by telephone and verified that I am speaking with the correct person using two identifiers.  Location: Patient: Home Provider: Clinic   I discussed the limitations, risks, security and privacy concerns of performing an evaluation and management service by telephone and the availability of in person appointments. I also discussed with the patient that there may be a patient responsible charge related to this service. The patient expressed understanding and agreed to proceed.  Follow Up Instructions:   I discussed the assessment and treatment plan with the patient. The patient was provided an opportunity to ask questions and all were answered. The patient agreed with the plan and demonstrated an understanding of the instructions.   The patient was advised to call back or seek an in-person evaluation if the symptoms worsen or if the condition fails to improve as anticipated.  I provided 20 minutes of non-face-to-face time during this encounter.  Meta Hatchet, PA    05/14/2021 11:03 PM Riley Smith  MRN:  [id number]  Chief Complaint: Follow up and medication management  HPI:     Visit Diagnosis:    ICD-10-CM   1. Insomnia, unspecified type  G47.00     2. Generalized anxiety disorder  F41.1     3. Moderate episode of recurrent major depressive disorder (HCC)  F33.1       Past Psychiatric History:  Major depressive disorder Generalized anxiety disorder Insomnia  Past Medical History:  Past Medical History:  Diagnosis Date   Anxiety    Depression    History reviewed. No pertinent surgical history.  Family Psychiatric History:  Patient is unsure  Family History:  Family History  Problem Relation Age of Onset   Healthy Mother    Depression Mother    Healthy Father     Social History:  Social History   Socioeconomic History   Marital  status: Single    Spouse name: Not on file   Number of children: 0   Years of education: Not on file   Highest education level: High school graduate  Occupational History   Not on file  Tobacco Use   Smoking status: Never   Smokeless tobacco: Never  Vaping Use   Vaping Use: Never used  Substance and Sexual Activity   Alcohol use: Never   Drug use: Never   Sexual activity: Not on file  Other Topics Concern   Not on file  Social History Narrative   Not on file   Social Determinants of Health   Financial Resource Strain: Not on file  Food Insecurity: Not on file  Transportation Needs: Not on file  Physical Activity: Not on file  Stress: Not on file  Social Connections: Not on file    Allergies: No Known Allergies  Metabolic Disorder Labs: Lab Results  Component Value Date   HGBA1C 5.2 07/02/2020   MPG 103 07/02/2020   No results found for: PROLACTIN Lab Results  Component Value Date   CHOL 184 07/02/2020   TRIG 207 (H) 07/02/2020   HDL 44 07/02/2020   CHOLHDL 4.2 07/02/2020   VLDL 41 (H) 07/02/2020   LDLCALC 99 07/02/2020   Lab Results  Component Value Date   TSH 4.570 (H) 07/02/2020    Therapeutic Level Labs: No results found for: LITHIUM No results found for: VALPROATE No components found for:  CBMZ  Current  Medications: Current Outpatient Medications  Medication Sig Dispense Refill   acetaminophen (TYLENOL) 325 MG tablet Take 650 mg by mouth every 6 (six) hours as needed for moderate pain.     buPROPion (WELLBUTRIN XL) 150 MG 24 hr tablet TAKE 1 TABLET (150 MG TOTAL) BY MOUTH DAILY. (Patient taking differently: Take 150 mg by mouth daily.) 30 tablet 2   escitalopram (LEXAPRO) 10 MG tablet Take 1 tablet (10 mg total) by mouth daily. 30 tablet 1   famotidine (PEPCID) 20 MG tablet Take 20 mg by mouth daily.     hydrOXYzine (ATARAX/VISTARIL) 25 MG tablet Take 1 tablet (25 mg total) by mouth 3 (three) times daily as needed for anxiety. 75 tablet 1    traZODone (DESYREL) 50 MG tablet Take 1 tablet (50 mg total) by mouth at bedtime as needed for sleep. 30 tablet 1   No current facility-administered medications for this visit.     Musculoskeletal: Strength & Muscle Tone: Unable to assess due to telemedicine visit Gait & Station: Unable to assess due to telemedicine visit Patient leans: Unable to assess due to telemedicine visit  Psychiatric Specialty Exam: Review of Systems  Psychiatric/Behavioral:  Positive for sleep disturbance. Negative for decreased concentration, dysphoric mood, hallucinations, self-injury and suicidal ideas. The patient is nervous/anxious. The patient is not hyperactive.    There were no vitals taken for this visit.There is no height or weight on file to calculate BMI.  General Appearance: Unable to assess due to telemedicine visit  Eye Contact:  Unable to assess due to telemedicine visit  Speech:  Clear and Coherent and Normal Rate  Volume:  Normal  Mood:  Anxious and Depressed  Affect:  Congruent and Depressed  Thought Process:  Coherent, Goal Directed, and Descriptions of Associations: Intact  Orientation:  Full (Time, Place, and Person)  Thought Content: WDL   Suicidal Thoughts:  No  Homicidal Thoughts:  No  Memory:  Immediate;   Good Recent;   Good Remote;   Good  Judgement:  Good  Insight:  Good  Psychomotor Activity:  Normal  Concentration:  Concentration: Good and Attention Span: Good  Recall:  Good  Fund of Knowledge: Good  Language: Good  Akathisia:  NA  Handed:  Right  AIMS (if indicated): not done  Assets:  Communication Skills Desire for Improvement Financial Resources/Insurance Housing Social Support  ADL's:  Intact  Cognition: WNL  Sleep:  Fair   Screenings: AIMS    Flowsheet Row [facility]  AIMS Total Score 0      AUDIT    Flowsheet Row [facility]  Alcohol Use Disorder Identification Test Final Score (AUDIT) 2      GAD-7    Flowsheet Row Video Visit from 05/14/2021 in [facility] Video Visit from 03/17/2021 in [facility] Video Visit from 01/29/2021 in [facility] Video Visit from 12/02/2020 in [facility]  Total GAD-7 Score 17 11 13 12       PHQ2-9    Flowsheet Row Video Visit from 05/14/2021 in [facility] Video Visit from 03/17/2021 in [facility] Video Visit from 01/29/2021 in [facility] Video Visit from 12/02/2020 in [facility] from 11/04/2020 in [facility]  PHQ-2 Total Score 4 3 2  3  5  PHQ-9 Total Score 17 15 14 12 22       Flowsheet Row Video Visit from 05/14/2021 in [facility] Video Visit from 03/17/2021 in [facility] Video Visit from 01/29/2021 in [facility]  C-SSRS RISK CATEGORY Moderate Risk Moderate Risk No Risk        Assessment and Plan:     1. Insomnia, unspecified type   2. Generalized anxiety disorder   3. Moderate episode of recurrent major depressive disorder [facility])  Patient to follow up in 7 weeks Provider spent a total of 20 minutes with the patient/reviewing patient's chart  [facility], PA 05/14/2021, 11:03 PM

## 2021-05-20 ENCOUNTER — Ambulatory Visit: Payer: Self-pay | Admitting: Physician Assistant

## 2021-05-20 ENCOUNTER — Other Ambulatory Visit: Payer: Self-pay

## 2021-05-20 VITALS — BP 114/62 | HR 76 | Temp 97.5°F | Resp 18 | Ht 73.0 in | Wt 231.0 lb

## 2021-05-20 DIAGNOSIS — R059 Cough, unspecified: Secondary | ICD-10-CM

## 2021-05-20 DIAGNOSIS — J029 Acute pharyngitis, unspecified: Secondary | ICD-10-CM

## 2021-05-20 DIAGNOSIS — F332 Major depressive disorder, recurrent severe without psychotic features: Secondary | ICD-10-CM

## 2021-05-20 LAB — POC COVID19 BINAXNOW: SARS Coronavirus 2 Ag: NEGATIVE

## 2021-05-20 MED ORDER — AMOXICILLIN 875 MG PO TABS
875.0000 mg | ORAL_TABLET | Freq: Two times a day (BID) | ORAL | 0 refills | Status: AC
  Filled 2021-05-20: qty 20, 10d supply, fill #0

## 2021-05-20 NOTE — Progress Notes (Signed)
Patient reports sore throat beginning over the weekend wth productive cough with clear and intermittent green sputum, Patient has only used ibuprofen. Patient denies any household members experiencing Sx.

## 2021-05-20 NOTE — Progress Notes (Signed)
New Patient Office Visit  Subjective:  Patient ID: Riley Smith, male    DOB: Jan 22, 2001  Age: 20 y.o. MRN: [id number]  CC:  Chief Complaint  Patient presents with   Sore Throat   Cough    HPI Riley Smith reports that he has been having a sore throat, along with a productive cough with clear and green sputum.  States that this started 6 days ago, denies fever, body aches, nausea or vomiting.  Denies sick contacts.  Reports that he has been eating and drinking okay.  States that he has been using ibuprofen with some relief.  Does endorse that he continues to have thoughts that he would be better off dead, states that they are easily dismissed, no active plan, adamantly denies any plans of self-harm.  Reports that he is active in his therapy and taking his medications as prescribed.    Past Medical History:  Diagnosis Date   Anxiety    Depression     History reviewed. No pertinent surgical history.  Family History  Problem Relation Age of Onset   Healthy Mother    Depression Mother    Healthy Father     Social History   Socioeconomic History   Marital status: Single    Spouse name: Not on file   Number of children: 0   Years of education: Not on file   Highest education level: High school graduate  Occupational History   Not on file  Tobacco Use   Smoking status: Never   Smokeless tobacco: Never  Vaping Use   Vaping Use: Never used  Substance and Sexual Activity   Alcohol use: Never   Drug use: Never   Sexual activity: Not Currently  Other Topics Concern   Not on file  Social History Narrative   Not on file   Social Determinants of Health   Financial Resource Strain: Not on file  Food Insecurity: Not on file  Transportation Needs: Not on file  Physical Activity: Not on file  Stress: Not on file  Social Connections: Not on file  Intimate Partner Violence: Not on file    ROS Review of Systems  Constitutional:  Negative for chills and fever.   HENT:  Positive for congestion and sore throat. Negative for ear pain, rhinorrhea, sinus pressure and trouble swallowing.   Eyes: Negative.   Respiratory:  Positive for cough. Negative for shortness of breath and wheezing.   Cardiovascular:  Negative for chest pain.  Gastrointestinal:  Negative for nausea and vomiting.  Endocrine: Negative.   Genitourinary: Negative.   Musculoskeletal:  Negative for myalgias.  Skin: Negative.   Allergic/Immunologic: Negative.   Neurological:  Negative for headaches.  Hematological: Negative.   Psychiatric/Behavioral:  Negative for self-injury, sleep disturbance and suicidal ideas.    Objective:   Today's Vitals: BP 114/62 (BP Location: Left Arm, Patient Position: Sitting, Cuff Size: Normal)   Pulse 76   Temp (!) 97.5 F (36.4 C) (Oral)   Resp 18   Ht 6\' 1"  (1.854 m)   Wt 231 lb (104.8 kg)   SpO2 99%   BMI 30.48 kg/m   Physical Exam Vitals and nursing note reviewed.  Constitutional:      Appearance: He is well-developed.  HENT:     Head: Normocephalic and atraumatic.     Salivary Glands: Right salivary gland is diffusely enlarged. Left salivary gland is diffusely enlarged.     Right Ear: Ear canal and external ear normal. Tympanic membrane  is erythematous.     Left Ear: Ear canal and external ear normal. Tympanic membrane is erythematous.     Mouth/Throat:     Mouth: Mucous membranes are moist.     Pharynx: Posterior oropharyngeal erythema present.     Tonsils: No tonsillar exudate. 1+ on the right. 1+ on the left.  Eyes:     Conjunctiva/sclera: Conjunctivae normal.     Pupils: Pupils are equal, round, and reactive to light.  Cardiovascular:     Rate and Rhythm: Normal rate and regular rhythm.     Pulses: Normal pulses.     Heart sounds: Normal heart sounds.  Pulmonary:     Effort: Pulmonary effort is normal.     Breath sounds: Normal breath sounds. No wheezing.  Musculoskeletal:        General: Normal range of motion.      Cervical back: Normal range of motion and neck supple.  Lymphadenopathy:     Cervical: Cervical adenopathy present.  Skin:    General: Skin is warm and dry.  Neurological:     General: No focal deficit present.     Mental Status: He is alert and oriented to person, place, and time.  Psychiatric:        Mood and Affect: Mood normal.        Behavior: Behavior normal.        Thought Content: Thought content normal.        Judgment: Judgment normal.    Assessment & Plan:   Problem List Items Addressed This Visit       Other   Severe episode of recurrent major depressive disorder, without psychotic features (HCC)   Other Visit Diagnoses     Pharyngitis, unspecified etiology    -  Primary   Relevant Medications   amoxicillin (AMOXIL) 875 MG tablet   Other Relevant Orders   POC COVID-19 (Completed)   Cough       Relevant Orders   POC COVID-19 (Completed)       Outpatient Encounter Medications as of 05/20/2021  Medication Sig   acetaminophen (TYLENOL) 325 MG tablet Take 650 mg by mouth every 6 (six) hours as needed for moderate pain.   amoxicillin (AMOXIL) 875 MG tablet Take 1 tablet (875 mg total) by mouth 2 (two) times daily for 10 days.   escitalopram (LEXAPRO) 10 MG tablet Take 1 tablet (10 mg total) by mouth daily.   famotidine (PEPCID) 20 MG tablet Take 20 mg by mouth daily.   hydrOXYzine (ATARAX/VISTARIL) 25 MG tablet Take 1 tablet (25 mg total) by mouth 3 (three) times daily as needed for anxiety.   traZODone (DESYREL) 50 MG tablet Take 1 tablet (50 mg total) by mouth at bedtime as needed for sleep.   buPROPion (WELLBUTRIN XL) 150 MG 24 hr tablet TAKE 1 TABLET (150 MG TOTAL) BY MOUTH DAILY. (Patient not taking: Reported on 05/20/2021)   No facility-administered encounter medications on file as of 05/20/2021.   1. Pharyngitis, unspecified etiology Rapid COVID test negative.  Trial amoxicillin, patient education given on supportive care, red flags given for prompt  reevaluation - POC COVID-19 - amoxicillin (AMOXIL) 875 MG tablet; Take 1 tablet (875 mg total) by mouth 2 (two) times daily for 10 days.  Dispense: 20 tablet; Refill: 0  2. Cough  - POC COVID-19  3. Severe episode of recurrent major depressive disorder, without psychotic features [facility]) Patient encouraged to continue follow-up with behavioral health specialist   I have reviewed the  patient's medical history (PMH, PSH, Social History, Family History, Medications, and allergies) , and have been updated if relevant. I spent 30 minutes reviewing chart and  face to face time with patient.    Follow-up: Return if symptoms worsen or fail to improve.   Kasandra Knudsen Mayers, PA-C

## 2021-05-20 NOTE — Patient Instructions (Signed)
You will take amoxicillin twice a day for the next 10 days.  I encourage you to continue using Tylenol as needed for your throat pain, make sure that you are drinking lots of fluids and get plenty of rest.  Please let us know if there is anything else we can do for you.  Roney Jaffe, PA-C Physician Assistant [facility] [email or web address]   Pharyngitis Pharyngitis is inflammation of the throat (pharynx). It is a very common cause of sore throat. Pharyngitis can be caused by a bacteria, but it is usually caused by a virus. Most cases of pharyngitis get better on their own without treatment. What are the causes? This condition may be caused by: Infection by viruses (viral). Viral pharyngitis spreads easily from person to person (is contagious) through coughing, sneezing, and sharing of personal items or utensils such as cups, forks, spoons, and toothbrushes. Infection by bacteria (bacterial). Bacterial pharyngitis may be spread by touching the nose or face after coming in contact with the bacteria, or through close contact, such as kissing. Allergies. Allergies can cause buildup of mucus in the throat (post-nasal drip), leading to inflammation and irritation. Allergies can also cause blocked nasal passages, forcing breathing through the mouth, which dries and irritates the throat. What increases the risk? You are more likely to develop this condition if: You are 20-59 years old. You are exposed to crowded environments such as daycare, school, or dormitory living. You live in a cold climate. You have a weakened disease-fighting (immune) system. What are the signs or symptoms? Symptoms of this condition vary by the cause. Common symptoms of this condition include: Sore throat. Fatigue. Low-grade fever. Stuffy nose (nasal congestion) and cough. Headache. Other symptoms may include: Glands in the neck (lymph nodes) that are  swollen. Skin rashes. Plaque-like film on the throat or tonsils. This is often a symptom of bacterial pharyngitis. Vomiting. Red, itchy eyes (conjunctivitis). Loss of appetite. Joint pain and muscle aches. Enlarged tonsils. How is this diagnosed? This condition may be diagnosed based on your medical history and a physical exam. Your health care provider will ask you questions about your illness and your symptoms. A swab of your throat may be done to check for bacteria (rapid strep test). Other lab tests may also be done, depending on the suspected cause, but these are rare. How is this treated? Many times, treatment is not needed for this condition. Pharyngitis usually gets better in 3-4 days without treatment. Bacterial pharyngitis may be treated with antibiotic medicines. Follow these instructions at home: Medicines Take over-the-counter and prescription medicines only as told by your health care provider. If you were prescribed an antibiotic medicine, take it as told by your health care provider. Do not stop taking the antibiotic even if you start to feel better. Use throat sprays to soothe your throat as told by your health care provider. Children can get pharyngitis. Do not give your child aspirin because of the association with Reye's syndrome. Managing pain To help with pain, try: Sipping warm liquids, such as broth, herbal tea, or warm water. Eating or drinking cold or frozen liquids, such as frozen ice pops. Gargling with a mixture of salt and water 3-4 times a day or as needed. To make salt water, completely dissolve -1 tsp (3-6 g) of salt in 1 cup (237 mL) of warm water. Sucking on hard candy or throat lozenges. Putting a cool-mist humidifier in your bedroom at night to moisten the air. Sitting in the bathroom  with the door closed for 5-10 minutes while you run hot water in the shower.  General instructions  Do not use any products that contain nicotine or tobacco. These  products include cigarettes, chewing tobacco, and vaping devices, such as e-cigarettes. If you need help quitting, ask your health care provider. Rest as told by your health care provider. Drink enough fluid to keep your urine pale yellow. How is this prevented? To help prevent becoming infected or spreading infection: Wash your hands often with soap and water for at least 20 seconds. If soap and water are not available, use hand sanitizer. Do not touch your eyes, nose, or mouth with unwashed hands, and wash hands after touching these areas. Do not share cups or eating utensils. Avoid close contact with people who are sick. Contact a health care provider if: You have large, tender lumps in your neck. You have a rash. You cough up green, yellow-brown, or bloody mucus. Get help right away if: Your neck becomes stiff. You drool or are unable to swallow liquids. You cannot drink or take medicines without vomiting. You have severe pain that does not go away, even after you take medicine. You have trouble breathing, and it is not caused by a stuffy nose. You have new pain and swelling in your joints such as the knees, ankles, wrists, or elbows. These symptoms may represent a serious problem that is an emergency. Do not wait to see if the symptoms will go away. Get medical help right away. Call your local emergency services (911 in the [place] Do not drive yourself to the hospital. Summary Pharyngitis is redness, pain, and swelling (inflammation) of the throat (pharynx). While pharyngitis can be caused by a bacteria, the most common causes are viral. Most cases of pharyngitis get better on their own without treatment. Bacterial pharyngitis is treated with antibiotic medicines. This information is not intended to replace advice given to you by your health care provider. Make sure you discuss any questions you have with your health care provider. Document Revised: 11/25/2020 Document Reviewed:  11/25/2020 Elsevier Patient Education  2022 [employer].

## 2021-05-26 ENCOUNTER — Other Ambulatory Visit: Payer: Self-pay

## 2021-05-31 ENCOUNTER — Other Ambulatory Visit (HOSPITAL_BASED_OUTPATIENT_CLINIC_OR_DEPARTMENT_OTHER): Payer: Self-pay

## 2021-06-03 NOTE — Telephone Encounter (Signed)
Patient came by checking status of [employer] form that was faxed 05/03/21.  Please advise. Pt can get another copy if necessary. [phone] call pt if any additional info is needed.

## 2021-06-30 ENCOUNTER — Telehealth (HOSPITAL_COMMUNITY): Payer: No Payment, Other | Admitting: Physician Assistant

## 2021-06-30 ENCOUNTER — Other Ambulatory Visit: Payer: Self-pay

## 2021-07-15 ENCOUNTER — Ambulatory Visit (HOSPITAL_COMMUNITY): Payer: No Payment, Other | Admitting: Licensed Clinical Social Worker

## 2022-04-13 ENCOUNTER — Emergency Department (HOSPITAL_COMMUNITY)
Admission: EM | Admit: 2022-04-13 | Discharge: 2022-04-13 | Disposition: A | Discharge status: Home / Self Care | Payer: Medicaid Other | Attending: Emergency Medicine | Admitting: Emergency Medicine

## 2022-04-13 ENCOUNTER — Encounter (HOSPITAL_COMMUNITY): Payer: Self-pay

## 2022-04-13 ENCOUNTER — Emergency Department (HOSPITAL_COMMUNITY): Payer: Medicaid Other

## 2022-04-13 ENCOUNTER — Other Ambulatory Visit: Payer: Self-pay

## 2022-04-13 DIAGNOSIS — E876 Hypokalemia: Secondary | ICD-10-CM | POA: Insufficient documentation

## 2022-04-13 DIAGNOSIS — J029 Acute pharyngitis, unspecified: Secondary | ICD-10-CM

## 2022-04-13 DIAGNOSIS — R0602 Shortness of breath: Secondary | ICD-10-CM | POA: Insufficient documentation

## 2022-04-13 DIAGNOSIS — R0789 Other chest pain: Secondary | ICD-10-CM | POA: Insufficient documentation

## 2022-04-13 DIAGNOSIS — R059 Cough, unspecified: Secondary | ICD-10-CM | POA: Insufficient documentation

## 2022-04-13 LAB — TROPONIN I (HIGH SENSITIVITY): Troponin I (High Sensitivity): 3 ng/L (ref <18)

## 2022-04-13 LAB — CBC
HCT: 44.1 % (ref 39.0–52.0)
Hemoglobin: 15.8 g/dL (ref 13.0–17.0)
MCH: 29.5 pg (ref 26.0–34.0)
MCHC: 35.8 g/dL (ref 30.0–36.0)
MCV: 82.4 fL (ref 80.0–100.0)
Platelets: 358 10*3/uL (ref 150–400)
RBC: 5.35 MIL/uL (ref 4.22–5.81)
RDW: 12.7 % (ref 11.5–15.5)
WBC: 8.3 10*3/uL (ref 4.0–10.5)
nRBC: 0 % (ref 0.0–0.2)

## 2022-04-13 LAB — BASIC METABOLIC PANEL
Anion gap: 8 (ref 5–15)
BUN: 5 mg/dL — ABNORMAL LOW (ref 6–20)
CO2: 27 mmol/L (ref 22–32)
Calcium: 8.6 mg/dL — ABNORMAL LOW (ref 8.9–10.3)
Chloride: 105 mmol/L (ref 98–111)
Creatinine, Ser: 1.03 mg/dL (ref 0.61–1.24)
GFR, Estimated: >60 mL/min (ref >60)
Glucose, Bld: 113 mg/dL — ABNORMAL HIGH (ref 70–99)
Potassium: 3.3 mmol/L — ABNORMAL LOW (ref 3.5–5.1)
Sodium: 140 mmol/L (ref 135–145)

## 2022-04-13 LAB — GROUP A STREP BY PCR: Group A Strep by PCR: NOT DETECTED

## 2022-04-13 MED ORDER — ALBUTEROL SULFATE HFA 108 (90 BASE) MCG/ACT IN AERS
1.0000 | INHALATION_SPRAY | Freq: Once | RESPIRATORY_TRACT | Status: AC
Start: 2022-04-13 — End: 2022-04-13
  Administered 2022-04-13: 1 via RESPIRATORY_TRACT
  Filled 2022-04-13: qty 6.7

## 2022-04-13 NOTE — ED Triage Notes (Signed)
Pt c/o CP, ShOB, sore throat that started yesterday. Pt reports that he had an episode of tightening in his throat and chest that quickly resolved that happened after he stepped off the bus.

## 2022-04-13 NOTE — ED Provider Triage Note (Signed)
Emergency Medicine Provider Triage Evaluation Note  Riley Smith , a 21 y.o. male  was evaluated in triage.  Pt complains of chest pain and "trouble breathing" intermittently for the past few months. The patient reports that yesterday he was getting off the bus when he has some chest pain and couldn't catch his breath. He was finally able too and mentioned he had chest pain the rest of the day. He reports that his sore throat started in the morning. Reports a cough since yesterday. Denies any nasal congestion or rhinorrhea. Reprots his breathing feels slower than usual.  Review of Systems  Positive:  Negative:   Physical Exam  BP 110/68 (BP Location: Right Arm)   Pulse 89   Temp 98.2 F (36.8 C) (Oral)   Resp 16   Ht 5\' 11"  (1.803 m)   Wt 118.4 kg   SpO2 96%   BMI 36.40 kg/m  Gen:   Awake, no distress   Resp:  Normal effort  MSK:   Moves extremities without difficulty  Other:  Clear OP. Patient speaking in ful sentences with ease.   Medical Decision Making  Medically screening exam initiated at 2:43 PM.  Appropriate orders placed.  was informed that the remainder of the evaluation will be completed by another provider, this initial triage assessment does not replace that evaluation, and the importance of remaining in the ED until their evaluation is complete.  Willl order cardiac workup   [name], Achille Rich 04/13/22 1445

## 2022-04-13 NOTE — Discharge Instructions (Addendum)
It was a pleasure taking care of you today!   Your workup was negative in the ED. You will be given ambulatory referral to cardiology, they will call to set up a follow-up appointment regarding today's ED visit.  You may follow-up with the [facility] department as needed. You may return to the ED if you are experiencing increasing/worsening chest pain, shortness of breath, or worsening symptoms.

## 2022-04-13 NOTE — ED Notes (Signed)
ED Provider at bedside. 

## 2022-04-13 NOTE — ED Provider Notes (Signed)
[facility] EMERGENCY DEPARTMENT Provider Note   CSN: [id number] Arrival date & time: 04/13/22  1401     History  No chief complaint on file.   Riley Smith is a 21 y.o. male who presents to the ED with concerns for chest pain and shortness of breath onset intermittently for the past several months.  Patient notes that yesterday he was getting off the bus when he began to have some chest tightness as well as chest pain and was unable to catch his breath.  Also notes he has had similar symptoms when going from his mother to his father's house.  Also notes that he has had increased stressors at work. Has associated throat tightness, sore throat, chest tightness, cough.  Notes that his symptoms are sometimes worse with temperature change.  Has tried over-the-counter cold medications with relief of his sore throat and cough.  Denies fever, nasal congestion, rhinorrhea, trouble swallowing. Denies past medical history of MI, asthma, diabetes, hypertension, cardiac catheterization, stents.  Has not been evaluated in the past for similar symptoms.  The history is provided by the patient. No language interpreter was used.       Home Medications Prior to Admission medications   Medication Sig Start Date End Date Taking? Authorizing Provider  acetaminophen (TYLENOL) 325 MG tablet Take 650 mg by mouth every 6 (six) hours as needed for moderate pain.    [provider]  buPROPion (WELLBUTRIN XL) 150 MG 24 hr tablet TAKE 1 TABLET (150 MG TOTAL) BY MOUTH DAILY. Patient not taking: Reported on 05/20/2021 12/02/20 12/02/21  Karel Jarvis E, PA  escitalopram (LEXAPRO) 10 MG tablet Take 1 tablet (10 mg total) by mouth daily. 01/29/21 01/29/22  Nwoko, Tommas Olp, PA  famotidine (PEPCID) 20 MG tablet Take 20 mg by mouth daily.    [provider]  traZODone (DESYREL) 50 MG tablet Take 1 tablet (50 mg total) by mouth at bedtime as needed for sleep. 01/29/21 01/29/22  Meta Hatchet, PA      Allergies    Patient has no known allergies.    Review of Systems   Review of Systems  Constitutional:  Negative for fever.  HENT:  Positive for sore throat. Negative for congestion, rhinorrhea and trouble swallowing.   Respiratory:  Positive for cough and shortness of breath.   Cardiovascular:  Positive for chest pain.  All other systems reviewed and are negative.   Physical Exam Updated Vital Signs BP 114/75   Pulse 96   Temp 98 F (36.7 C) (Oral)   Resp 19   Ht 5\' 11"  (1.803 m)   Wt 118.4 kg   SpO2 100%   BMI 36.40 kg/m  Physical Exam Vitals and nursing note reviewed.  Constitutional:      General: He is not in acute distress.    Appearance: He is not diaphoretic.  HENT:     Head: Normocephalic and atraumatic.     Mouth/Throat:     Pharynx: No oropharyngeal exudate.  Eyes:     General: No scleral icterus.    Conjunctiva/sclera: Conjunctivae normal.  Cardiovascular:     Rate and Rhythm: Normal rate and regular rhythm.     Pulses: Normal pulses.     Heart sounds: Normal heart sounds.  Pulmonary:     Effort: Pulmonary effort is normal. No respiratory distress.     Breath sounds: Normal breath sounds. No wheezing.  Chest:     Chest wall: No tenderness.  Comments: No chest wall TTP. Abdominal:     General: There is no distension.     Palpations: Abdomen is soft.  Musculoskeletal:        General: Normal range of motion.     Cervical back: Normal range of motion and neck supple.  Skin:    General: Skin is warm and dry.  Neurological:     Mental Status: He is alert.  Psychiatric:        Behavior: Behavior normal.     ED Results / Procedures / Treatments   Labs (all labs ordered are listed, but only abnormal results are displayed) Labs Reviewed  BASIC METABOLIC PANEL - Abnormal; Notable for the following components:      Result Value   Potassium 3.3 (*)    Glucose, Bld 113 (*)    BUN 5 (*)    Calcium 8.6 (*)    All other components  within normal limits  GROUP A STREP BY PCR  CBC  TROPONIN I (HIGH SENSITIVITY)  TROPONIN I (HIGH SENSITIVITY)    EKG EKG Interpretation  Date/Time:  Wednesday April 13 2022 14:32:28 EDT Ventricular Rate:  83 PR Interval:  136 QRS Duration: 86 QT Interval:  344 QTC Calculation: 404 R Axis:   62 Text Interpretation: Normal sinus rhythm with sinus arrhythmia Normal ECG When compared with ECG of 25-Dec-2020 14:48, No significant change since last tracing Confirmed by Meridee Score [phone]) on 04/13/2022 8:27:02 PM  Radiology DG Chest 2 View  Result Date: 04/13/2022 CLINICAL DATA:  Chest pain, shortness of breath EXAM: CHEST - 2 VIEW COMPARISON:  None Available. FINDINGS: The heart size and mediastinal contours are within normal limits. Both lungs are clear. The visualized skeletal structures are unremarkable. IMPRESSION: Normal study. Electronically Signed   By: Charlett Nose M.D.   On: 04/13/2022 15:16    Procedures Procedures    Medications Ordered in ED Medications  albuterol (VENTOLIN HFA) 108 (90 Base) MCG/ACT inhaler 1 puff (1 puff Inhalation Provided for home use 04/13/22 2145)    ED Course/ Medical Decision Making/ A&P                           Medical Decision Making Amount and/or Complexity of Data Reviewed Labs: ordered. Radiology: ordered.  Risk Prescription drug management.   Patient presents to the ED with concerns for chest pain and shortness of breath intermittently over the past several months.  Had episode yesterday while getting off the bus consisted of chest tightness as well as chest pain and unable to catch his breath.  Notes similar symptoms when going from his mother to father's house.  Also notes increased stressors at work.  No cardiac risk factors.  No history of asthma, diabetes, hypertension, cardiac catheterization, stents.  Has not been evaluated for previously.  Patient afebrile.  On exam patient without tenderness to palpation noted to chest wall.   No acute cardiovascular, respiratory, abdominal exam findings.  Differential diagnosis includes ACS, pneumonia, pneumothorax, strep pharyngitis, viral pharyngitis.     EKG:  NSR without acute ST/T changes.  Labs:  I ordered, and personally interpreted labs.  The pertinent results include:   Initial troponin at 3 CBC unremarkable BMP with slightly decreased potassium at 3.3, slightly elevated glucose at 113, otherwise unremarkable. Strep swab negative.  Imaging: I ordered imaging studies including CXR I independently visualized and interpreted imaging which showed: no acute findings I agree with the radiologist interpretation  Disposition:  Presentation suspicious for atypical chest pain. EKG without acute ST/T changes, troponins negative, chest x-ray negative, low suspicion for ACS at this time. Chest x-ray without acute findings, vital signs stable, doubt aortic dissection or pneumothorax at this time. Heart score, patient at low risk. Case discussed with attending who agrees with discharge treatment plan at this time.  Doubt strep pharyngitis at this time, strep swab negative.  Likely viral pharyngitis.  After consideration of the diagnostic results and the patients response to treatment, I feel that the patient would benefit from Discharge home.  Ambulatory referral to cardiology given at discharge.  Patient also provided with inhaler prior to discharge.  Supportive care measures and strict return precautions discussed with patient at bedside. Pt acknowledges and verbalizes understanding. Pt appears safe for discharge. Follow up as indicated in discharge paperwork.   This chart was dictated using voice recognition software, Dragon. Despite the best efforts of this provider to proofread and correct errors, errors may still occur which can change documentation meaning.   Final Clinical Impression(s) / ED Diagnoses Final diagnoses:  Atypical chest pain  Shortness of breath    Rx / DC  Orders ED Discharge Orders          Ordered    Ambulatory referral to Cardiology       Comments: If you have not heard from the Cardiology office within the next 72 hours please call [phone].   04/13/22 2102              Lauree Yurick A, PA-C 04/13/22 2207    Hayden Rasmussen, MD 04/14/22 [phone]

## 2022-04-26 NOTE — Progress Notes (Addendum)
Referring-Soijett Blue PA-C Reason for referral-chest pain  HPI: 21 year old male for evaluation of chest pain at request of Soijett  Blue PA-C.  Patient seen in the emergency room August 2 with complaints of chest pain and dyspnea.  Chest x-ray normal.  Hemoglobin normal.  Potassium 3.3.  Troponin normal.  Cardiology asked to evaluate.  Patient states that for the past several months he has had intermittent chest pain.  It is in the substernal/left chest area.  It is described as a pressure with an occasional flutter.  It lasts minutes to hours.  Resolves spontaneously.  Occurs both with exertion and at rest.  Not pleuritic, positional or related to food.  There is some associated dyspnea but no nausea or diaphoresis.  He also describes dyspnea on exertion.  However there is no orthopnea, PND, pedal edema or syncope.  Cardiology now asked to evaluate.  Current Outpatient Medications  Medication Sig Dispense Refill   acetaminophen (TYLENOL) 325 MG tablet Take 650 mg by mouth every 6 (six) hours as needed for moderate pain.     escitalopram (LEXAPRO) 10 MG tablet Take 1 tablet (10 mg total) by mouth daily. (Patient taking differently: Take 10 mg by mouth as needed.) 30 tablet 1   famotidine (PEPCID) 20 MG tablet Take 20 mg by mouth as needed for heartburn or indigestion.     traZODone (DESYREL) 50 MG tablet Take 1 tablet (50 mg total) by mouth at bedtime as needed for sleep. 30 tablet 1   No current facility-administered medications for this visit.    No Known Allergies   Past Medical History:  Diagnosis Date   Anxiety    Depression     Past Surgical History:  Procedure Laterality Date   No prior surgery      Social History   Socioeconomic History   Marital status: Single    Spouse name: Not on file   Number of children: 0   Years of education: Not on file   Highest education level: High school graduate  Occupational History   Not on file  Tobacco Use   Smoking status:  Never   Smokeless tobacco: Never  Vaping Use   Vaping Use: Never used  Substance and Sexual Activity   Alcohol use: Yes    Comment: occ   Drug use: Never   Sexual activity: Not Currently  Other Topics Concern   Not on file  Social History Narrative   Not on file   Social Determinants of Health   Financial Resource Strain: Not on file  Food Insecurity: Not on file  Transportation Needs: Not on file  Physical Activity: Not on file  Stress: Not on file  Social Connections: Not on file  Intimate Partner Violence: Not on file    Family History  Problem Relation Age of Onset   Healthy Mother    Depression Mother    Healthy Father     ROS: no fevers or chills, productive cough, hemoptysis, dysphasia, odynophagia, melena, hematochezia, dysuria, hematuria, rash, seizure activity, orthopnea, PND, pedal edema, claudication. Remaining systems are negative.  Physical Exam:   Blood pressure (!) 92/50, pulse 68, height 6\' 1"  (1.854 m).  General:  Well developed/well nourished in NAD Skin warm/dry Patient not depressed No peripheral clubbing Back-normal HEENT-normal/normal eyelids Neck supple/normal carotid upstroke bilaterally; no bruits; no JVD; no thyromegaly chest - CTA/ normal expansion CV - RRR/normal S1 and S2; no murmurs, rubs or gallops;  PMI nondisplaced Abdomen -NT/ND, no HSM, no  mass, + bowel sounds, no bruit 2+ femoral pulses, no bruits Ext-no edema, chords, 2+ DP Neuro-grossly nonfocal  ECG -April 13, 2022-normal sinus rhythm with no ST changes.  Personally reviewed  A/P  1 chest pain-symptoms are atypical.  Etiology unclear.  Previous electrocardiogram showed no ST changes.  Troponin was normal.  I will arrange an echocardiogram to assess LV function.  If normal will not pursue further cardiac evaluation.  2 Dyspnea-patient describes some dyspnea on exertion.  We will arrange echocardiogram to assess LV function.  Previous chest x-ray without  infiltrates.  Olga Millers, MD

## 2022-05-09 ENCOUNTER — Ambulatory Visit: Payer: Self-pay | Attending: Cardiology | Admitting: Cardiology

## 2022-05-09 ENCOUNTER — Encounter: Payer: Self-pay | Admitting: Cardiology

## 2022-05-09 VITALS — BP 92/50 | HR 68 | Ht 73.0 in | Wt 257.4 lb

## 2022-05-09 DIAGNOSIS — R072 Precordial pain: Secondary | ICD-10-CM | POA: Insufficient documentation

## 2022-05-09 DIAGNOSIS — R0609 Other forms of dyspnea: Secondary | ICD-10-CM | POA: Insufficient documentation

## 2022-05-09 NOTE — Patient Instructions (Signed)
Medication Instructions:  Your physician recommends that you continue on your current medications as directed. Please refer to the Current Medication list given to you today.  *If you need a refill on your cardiac medications before your next appointment, please call your pharmacy*   Lab Work: None If you have labs (blood work) drawn today and your tests are completely normal, you will receive your results only by: MyChart Message (if you have MyChart) OR A paper copy in the mail If you have any lab test that is abnormal or we need to change your treatment, we will call you to review the results.   Testing/Procedures: Your physician has requested that you have an echocardiogram. Echocardiography is a painless test that uses sound waves to create images of your heart. It provides your doctor with information about the size and shape of your heart and how well your heart's chambers and valves are working. This procedure takes approximately one hour. There are no restrictions for this procedure.    Follow-Up: At [facility], you and your health needs are our priority.  As part of our continuing mission to provide you with exceptional heart care, we have created designated Provider Care Teams.  These Care Teams include your primary Cardiologist (physician) and Advanced Practice Providers (APPs -  Physician Assistants and Nurse Practitioners) who all work together to provide you with the care you need, when you need it.  We recommend signing up for the patient portal called "MyChart".  Sign up information is provided on this After Visit Summary.  MyChart is used to connect with patients for Virtual Visits (Telemedicine).  Patients are able to view lab/test results, encounter notes, upcoming appointments, etc.  Non-urgent messages can be sent to your provider as well.   To learn more about what you can do with MyChart, go to [email or web address].    Your next appointment:    As needed  per normal echo  The format for your next appointment:   In Person  Provider:   Olga Millers, MD     Other Instructions   Important Information About Sugar

## 2022-05-20 ENCOUNTER — Ambulatory Visit (HOSPITAL_COMMUNITY): Payer: Commercial Managed Care - HMO | Attending: Cardiology

## 2022-05-20 DIAGNOSIS — R072 Precordial pain: Secondary | ICD-10-CM | POA: Insufficient documentation

## 2022-05-20 LAB — ECHOCARDIOGRAM COMPLETE
Area-P 1/2: 3.65 cm2
S' Lateral: 3.5 cm

## 2022-05-26 ENCOUNTER — Encounter: Payer: Self-pay | Admitting: *Deleted

## 2022-08-09 ENCOUNTER — Other Ambulatory Visit: Payer: Self-pay

## 2022-08-09 ENCOUNTER — Encounter (HOSPITAL_COMMUNITY): Payer: Self-pay | Admitting: Emergency Medicine

## 2022-08-09 ENCOUNTER — Emergency Department (HOSPITAL_COMMUNITY)
Admission: EM | Admit: 2022-08-09 | Discharge: 2022-08-09 | Disposition: A | Discharge status: Home / Self Care | Payer: Commercial Managed Care - HMO | Attending: Emergency Medicine | Admitting: Emergency Medicine

## 2022-08-09 DIAGNOSIS — Z20822 Contact with and (suspected) exposure to covid-19: Secondary | ICD-10-CM | POA: Diagnosis not present

## 2022-08-09 DIAGNOSIS — R000 Tachycardia, unspecified: Secondary | ICD-10-CM | POA: Diagnosis not present

## 2022-08-09 DIAGNOSIS — R451 Restlessness and agitation: Secondary | ICD-10-CM

## 2022-08-09 DIAGNOSIS — F12959 Cannabis use, unspecified with psychotic disorder, unspecified: Secondary | ICD-10-CM | POA: Diagnosis not present

## 2022-08-09 DIAGNOSIS — F1210 Cannabis abuse, uncomplicated: Secondary | ICD-10-CM | POA: Insufficient documentation

## 2022-08-09 DIAGNOSIS — Z79899 Other long term (current) drug therapy: Secondary | ICD-10-CM | POA: Diagnosis not present

## 2022-08-09 DIAGNOSIS — F12951 Cannabis use, unspecified with psychotic disorder with hallucinations: Secondary | ICD-10-CM | POA: Insufficient documentation

## 2022-08-09 DIAGNOSIS — R441 Visual hallucinations: Secondary | ICD-10-CM

## 2022-08-09 HISTORY — DX: Other symptoms and signs involving emotional state: R45.89

## 2022-08-09 LAB — CBC WITH DIFFERENTIAL/PLATELET
Abs Immature Granulocytes: 0.06 10*3/uL (ref 0.00–0.07)
Basophils Absolute: 0 10*3/uL (ref 0.0–0.1)
Basophils Relative: 0 %
Eosinophils Absolute: 0 10*3/uL (ref 0.0–0.5)
Eosinophils Relative: 0 %
HCT: 42.7 % (ref 39.0–52.0)
Hemoglobin: 14.9 g/dL (ref 13.0–17.0)
Immature Granulocytes: 0 %
Lymphocytes Relative: 7 %
Lymphs Abs: 1 10*3/uL (ref 0.7–4.0)
MCH: 29.4 pg (ref 26.0–34.0)
MCHC: 34.9 g/dL (ref 30.0–36.0)
MCV: 84.4 fL (ref 80.0–100.0)
Monocytes Absolute: 1.2 10*3/uL — ABNORMAL HIGH (ref 0.1–1.0)
Monocytes Relative: 8 %
Neutro Abs: 12.7 10*3/uL — ABNORMAL HIGH (ref 1.7–7.7)
Neutrophils Relative %: 85 %
Platelets: 399 10*3/uL (ref 150–400)
RBC: 5.06 MIL/uL (ref 4.22–5.81)
RDW: 12.7 % (ref 11.5–15.5)
WBC: 15 10*3/uL — ABNORMAL HIGH (ref 4.0–10.5)
nRBC: 0 % (ref 0.0–0.2)

## 2022-08-09 LAB — COMPREHENSIVE METABOLIC PANEL
ALT: 23 U/L (ref 0–44)
AST: 33 U/L (ref 15–41)
Albumin: 4 g/dL (ref 3.5–5.0)
Alkaline Phosphatase: 47 U/L (ref 38–126)
Anion gap: 16 — ABNORMAL HIGH (ref 5–15)
BUN: 9 mg/dL (ref 6–20)
CO2: 22 mmol/L (ref 22–32)
Calcium: 9.7 mg/dL (ref 8.9–10.3)
Chloride: 102 mmol/L (ref 98–111)
Creatinine, Ser: 1.07 mg/dL (ref 0.61–1.24)
GFR, Estimated: >60 mL/min (ref >60)
Glucose, Bld: 129 mg/dL — ABNORMAL HIGH (ref 70–99)
Potassium: 3.5 mmol/L (ref 3.5–5.1)
Sodium: 140 mmol/L (ref 135–145)
Total Bilirubin: 0.8 mg/dL (ref 0.3–1.2)
Total Protein: 6.6 g/dL (ref 6.5–8.1)

## 2022-08-09 LAB — URINALYSIS, ROUTINE W REFLEX MICROSCOPIC
Bilirubin Urine: NEGATIVE
Glucose, UA: NEGATIVE mg/dL
Hgb urine dipstick: NEGATIVE
Ketones, ur: NEGATIVE mg/dL
Leukocytes,Ua: NEGATIVE
Nitrite: NEGATIVE
Protein, ur: 30 mg/dL — AB
Specific Gravity, Urine: 1.01 (ref 1.005–1.030)
pH: 6.5 (ref 5.0–8.0)

## 2022-08-09 LAB — MAGNESIUM: Magnesium: 2 mg/dL (ref 1.7–2.4)

## 2022-08-09 LAB — URINALYSIS, MICROSCOPIC (REFLEX)
Bacteria, UA: NONE SEEN
Squamous Epithelial / HPF: NONE SEEN (ref 0–5)

## 2022-08-09 LAB — RAPID URINE DRUG SCREEN, HOSP PERFORMED
Amphetamines: NOT DETECTED
Barbiturates: NOT DETECTED
Benzodiazepines: NOT DETECTED
Cocaine: NOT DETECTED
Opiates: NOT DETECTED
Tetrahydrocannabinol: POSITIVE — AB

## 2022-08-09 LAB — RESP PANEL BY RT-PCR (FLU A&B, COVID) ARPGX2
Influenza A by PCR: NEGATIVE
Influenza B by PCR: NEGATIVE
SARS Coronavirus 2 by RT PCR: NEGATIVE

## 2022-08-09 LAB — CK: Total CK: 441 U/L — ABNORMAL HIGH (ref 49–397)

## 2022-08-09 LAB — SALICYLATE LEVEL: Salicylate Lvl: <7 mg/dL — ABNORMAL LOW (ref 7.0–30.0)

## 2022-08-09 LAB — ETHANOL: Alcohol, Ethyl (B): <10 mg/dL (ref <10)

## 2022-08-09 LAB — ACETAMINOPHEN LEVEL: Acetaminophen (Tylenol), Serum: <10 ug/mL — ABNORMAL LOW (ref 10–30)

## 2022-08-09 MED ORDER — LORAZEPAM 2 MG/ML IJ SOLN
1.0000 mg | Freq: Once | INTRAMUSCULAR | Status: AC
  Administered 2022-08-09: 1 mg via INTRAVENOUS
  Filled 2022-08-09: qty 1

## 2022-08-09 MED ORDER — LORAZEPAM 2 MG/ML IJ SOLN: 1.0000 mg | Freq: Once | INTRAMUSCULAR | Status: DC | PRN

## 2022-08-09 MED ORDER — HALOPERIDOL LACTATE 5 MG/ML IJ SOLN
2.0000 mg | Freq: Once | INTRAMUSCULAR | Status: AC
  Administered 2022-08-09: 2 mg via INTRAVENOUS
  Filled 2022-08-09: qty 1

## 2022-08-09 MED ORDER — LORAZEPAM 2 MG/ML IJ SOLN
2.0000 mg | Freq: Once | INTRAMUSCULAR | Status: AC
  Administered 2022-08-09: 2 mg via INTRAVENOUS
  Filled 2022-08-09: qty 1

## 2022-08-09 MED ORDER — LACTATED RINGERS IV BOLUS
1000.0000 mL | Freq: Once | INTRAVENOUS | Status: AC
  Administered 2022-08-09: 1000 mL via INTRAVENOUS

## 2022-08-09 NOTE — Discharge Instructions (Signed)

## 2022-08-09 NOTE — ED Notes (Signed)
Pt ate some dinner. Resting quietly with eyes closed.

## 2022-08-09 NOTE — ED Notes (Signed)
Mother's phone number Djimon Lundstrom- [phone]

## 2022-08-09 NOTE — ED Notes (Signed)
Pt dropping sats while sleeping after haldol and 2nd dose of ativan.  PT placed on 2L for support.

## 2022-08-09 NOTE — ED Notes (Signed)
Resting quietly with eyes closed. No acute distress.

## 2022-08-09 NOTE — ED Provider Notes (Addendum)
[facility] EMERGENCY DEPARTMENT Provider Note   CSN: [id number] Arrival date & time: 08/09/22  2409     History  Chief Complaint  Patient presents with   Drug Overdose    ABHI MOCCIA is a 21 y.o. male.   Drug Overdose  Patient presents for altered mental status.  He reportedly took 1 delta 8 CBD gummy last night.  He has had some agitation.  He was able to go to bed.  When he woke up, he seemed normal.  He had recurrence of agitation.  Agitation is characterized by shouting, cursing, and hyperactive movements.  Patient received 250 cc IVF prior to arrival.  Vital signs are notable for moderate hypertension and tachycardia to the range of 150.  Patient is prescribed any medications.  He has reportedly not been taking these.  Per chart review, medical history includes anxiety, depression, suicidal ideations.  Previously prescribed medications include Pepcid, trazodone, and Lexapro.     Home Medications Prior to Admission medications   Medication Sig Start Date End Date Taking? Authorizing Provider  acetaminophen (TYLENOL) 325 MG tablet Take 650 mg by mouth every 6 (six) hours as needed for moderate pain.    [provider]  escitalopram (LEXAPRO) 10 MG tablet Take 1 tablet (10 mg total) by mouth daily. Patient taking differently: Take 10 mg by mouth as needed. 01/29/21 05/09/22  Nwoko, Tommas Olp, PA  famotidine (PEPCID) 20 MG tablet Take 20 mg by mouth as needed for heartburn or indigestion.    [provider]  traZODone (DESYREL) 50 MG tablet Take 1 tablet (50 mg total) by mouth at bedtime as needed for sleep. 01/29/21 05/09/22  Meta Hatchet, PA      Allergies    Patient has no known allergies.    Review of Systems   Review of Systems  Unable to perform ROS: Mental status change  Psychiatric/Behavioral:  Positive for agitation and behavioral problems. The patient is hyperactive.     Physical Exam Updated Vital Signs BP 105/62 (BP  Location: Left Arm)   Pulse 80   Temp 99.1 F (37.3 C) (Oral)   Resp 18   SpO2 98%  Physical Exam Vitals and nursing note reviewed.  Constitutional:      Appearance: He is well-developed. He is not ill-appearing, toxic-appearing or diaphoretic.  HENT:     Head: Normocephalic and atraumatic.     Right Ear: External ear normal.     Left Ear: External ear normal.     Nose: Nose normal.  Eyes:     Extraocular Movements: Extraocular movements intact.     Conjunctiva/sclera: Conjunctivae normal.  Cardiovascular:     Rate and Rhythm: Regular rhythm. Tachycardia present.  Pulmonary:     Effort: Pulmonary effort is normal. No respiratory distress.  Abdominal:     General: There is no distension.  Musculoskeletal:        General: No swelling or deformity. Normal range of motion.     Cervical back: Normal range of motion and neck supple.     Right lower leg: No edema.     Left lower leg: No edema.  Skin:    General: Skin is warm and dry.     Coloration: Skin is not jaundiced or pale.  Neurological:     General: No focal deficit present.     Mental Status: He is alert. He is disoriented.  Psychiatric:        Mood and Affect: Affect is  labile.        Speech: Speech is rapid and pressured.        Behavior: Behavior is agitated and hyperactive.    ED Results / Procedures / Treatments   Labs (all labs ordered are listed, but only abnormal results are displayed) Labs Reviewed  COMPREHENSIVE METABOLIC PANEL - Abnormal; Notable for the following components:      Result Value   Glucose, Bld 129 (*)    Anion gap 16 (*)    All other components within normal limits  RAPID URINE DRUG SCREEN, HOSP PERFORMED - Abnormal; Notable for the following components:   Tetrahydrocannabinol POSITIVE (*)    All other components within normal limits  CBC WITH DIFFERENTIAL/PLATELET - Abnormal; Notable for the following components:   WBC 15.0 (*)    Neutro Abs 12.7 (*)    Monocytes Absolute 1.2 (*)     All other components within normal limits  SALICYLATE LEVEL - Abnormal; Notable for the following components:   Salicylate Lvl <7.0 (*)    All other components within normal limits  ACETAMINOPHEN LEVEL - Abnormal; Notable for the following components:   Acetaminophen (Tylenol), Serum <10 (*)    All other components within normal limits  URINALYSIS, ROUTINE W REFLEX MICROSCOPIC - Abnormal; Notable for the following components:   Protein, ur 30 (*)    All other components within normal limits  CK - Abnormal; Notable for the following components:   Total CK 441 (*)    All other components within normal limits  RESP PANEL BY RT-PCR (FLU A&B, COVID) ARPGX2  ETHANOL  MAGNESIUM  URINALYSIS, MICROSCOPIC (REFLEX)    EKG EKG Interpretation  Date/Time:  Tuesday August 09 2022 08:55:12 EST Ventricular Rate:  120 PR Interval:  173 QRS Duration: 87 QT Interval:  299 QTC Calculation: 423 R Axis:   54 Text Interpretation: Sinus tachycardia Confirmed by Gloris Manchester ([id number]) on 08/09/2022 9:10:02 AM  Radiology No results found.  Procedures Procedures    Medications Ordered in ED Medications  LORazepam (ATIVAN) injection 1 mg (has no administration in time range)  lactated ringers bolus 1,000 mL (1,000 mLs Intravenous New Bag/Given 08/09/22 0931)  LORazepam (ATIVAN) injection 2 mg (2 mg Intravenous Given 08/09/22 0845)  LORazepam (ATIVAN) injection 1 mg (1 mg Intravenous Given 08/09/22 0921)  haloperidol lactate (HALDOL) injection 2 mg (2 mg Intravenous Given 08/09/22 0920)    ED Course/ Medical Decision Making/ A&P                           Medical Decision Making Amount and/or Complexity of Data Reviewed Labs: ordered.  Risk Prescription drug management.   This patient presents to the ED for concern of altered mental status, this involves an extensive number of treatment options, and is a complaint that carries with it a high risk of complications and morbidity.  The  differential diagnosis includes drug overdose, intoxication, withdrawal, metabolic derangements   Co morbidities that complicate the patient evaluation  Anxiety, depression, suicidal ideation   Additional history obtained:  Additional history obtained from patient's mother External records from outside source obtained and reviewed including EMR   Lab Tests:  I Ordered, and personally interpreted labs.  The pertinent results include: Notable findings include leukocytosis and moderate elevation in CK.  UDS positive for THC only.  Lab work otherwise unremarkable.  Cardiac Monitoring: / EKG:  The patient was maintained on a cardiac monitor.  I personally viewed and  interpreted the cardiac monitored which showed an underlying rhythm of: Sinus rhythm   Consultations Obtained:  I requested consultation with the TTS,  and discussed lab and imaging findings as well as pertinent plan - they recommend: (Pending)   Problem List / ED Course / Critical interventions / Medication management  Patient is a 21 year old male who presents to the ED for altered mental status.  This was first noticed by his mother last night.  At that time, patient stated that he had taken 1 delta 8 gummy.  He did go to sleep and woke up, appearing in his normal state of health.  Shortly thereafter, he had recurrence of altered mental status and bizarre behavior.  On arrival in the ED, patient is awake.  He is repeatedly shouting "F'ing yes".  He has compulsive movements of his arms.  He is slightly redirectable.  Pupils are normal size and reactive.  He has no diaphoresis, rigidity, or clonus.  Patient was placed on the ED stretcher.  2 mg of Ativan was given.  IV fluids and laboratory work-up was ordered.  EKG shows sinus tachycardia.  Following initial dose of Ativan, patient has some improvement in his tachycardia and mild improvement in his agitated behavior.  Additional Ativan small dose of Haldol was ordered.   Following that, patient went to sleep.  Vital signs normalized.  He had a brief drop in his SPO2.  He was placed on 2 L of supplemental oxygen.  SPO2 is 99%.  Oxygen was weaned down to 1 L.  Laboratory work-up shows leukocytosis and moderate elevation in CK.  Patient's vital signs remained normal.  He does not need supplemental oxygen.  When he awakened, he was alert and oriented.  He does remember this morning.  He states that he was having visual hallucinations that he describes as strange colors.  He states that he continues to have these.  He also endorses current anxiety.  He states that he has been off of his psychiatric medications for the past several months.  Previously, he was treated for depression and anxiety only.  He does not have a history of psychosis.  Patient is agreeable to remain in the ED for further observation and TTS consult.  Patient is medically cleared at this time. I ordered medication including Ativan and Haldol for agitation, IV fluids for hydration Reevaluation of the patient after these medicines showed that the patient improved I have reviewed the patients home medicines and have made adjustments as needed   Social Determinants of Health:  Has access to outpatient care   CRITICAL CARE Performed by: Gloris Manchester   Total critical care time: 45 minutes  Critical care time was exclusive of separately billable procedures and treating other patients.  Critical care was necessary to treat or prevent imminent or life-threatening deterioration.  Critical care was time spent personally by me on the following activities: development of treatment plan with patient and/or surrogate as well as nursing, discussions with consultants, evaluation of patient's response to treatment, examination of patient, obtaining history from patient or surrogate, ordering and performing treatments and interventions, ordering and review of laboratory studies, ordering and review of radiographic  studies, pulse oximetry and re-evaluation of patient's condition.         Final Clinical Impression(s) / ED Diagnoses Final diagnoses:  Agitation  Visual hallucinations    Rx / DC Orders ED Discharge Orders     None         Gloris Manchester, MD  08/09/22 1345    Gloris Manchesterixon, Zygmund Passero, MD 08/09/22 1346

## 2022-08-09 NOTE — ED Triage Notes (Signed)
Pt arrives via EMS- pt took 150mg  of delta 8. Pt's mother believes he took the pills last night around 7pm, and pt went to bed. Pt woke up and had breakfast, seemingly normal. But then pt's mother states he starting acting strange again. He is loud, yelling, "Fing YES!! I'm tripping." Closing his eyes and raising his arms. EMS gave NS. HR 150, CBG 181, BP 150/93

## 2022-08-09 NOTE — ED Notes (Signed)
Resting quietly with eyes closed. No acute distress. A&O x4 with appropriate conversation when awakened.

## 2022-08-09 NOTE — Consult Note (Signed)
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 male patient who arrived via EMS after patient took 150 mg of delta 8 last night on 08/08/2022.  Reportedly patient took around 7 PM felt fine and went to bed.  Patient woke up this morning and had breakfast and appeared normal however patient randomly started acting strange.  Patient started yelling around the house stating "F* yes! I am tripping!" Pt was raising his arms in the air with his eyes closed. Pt also appeared more agitated/aggressive.  Patient has history of anxiety and depression.  He was previously prescribed Lexapro 10 mg and trazodone 50 mg however has been off these medications for several months.  Subjective:   [facility] for face-to-face evaluation.  Patient is sedated but able to engage decently well in conversation.  Patient received the p.m. 2 mg IM at 0845.  He then received Haldol 2 mg and lorazepam 1 mg IM around 0920.  Patient confirms he was given a delta 8 gummy that is 150 mg from his friend for his birthday tomorrow.  Patient states he has never taken a delta 8 or delta 9 products before and assumed he would feel the same as marijuana.  Patient stated after he took the gummy he did feel irritable and agitated but went to sleep.  Patient states this morning he felt like he was "tripping" and does not remember much of what was going on at the house.  Patient denies ever threatening to harm himself or anyone else.  She denies current suicidal ideations.  Denies homicidal ideations.  Patient denies any  auditory or visual hallucinations.  He currently lives with his mother, explains that relationship is okay and they do argue a lot.  His 3 sisters and his mom's boyfriend also live in the house.  He reports a good relationship with them.  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  Patient tells me he has been sleeping and eating fine.  I asked patient why he discontinued psychotropic medications and he stated "I was feeling better."  Stated patient on the importance of medication compliance especially his antidepressant.  Patient agreed to discontinue illicit substance use and to return to the outpatient follow-up to discuss restarting medications.  Patient is able to contract for safety, does feel safe leaving the hospital today.  Spoke with patient's mother, Keegen Furner, at [phone].She confirms these bizarre and erratic behaviors appeared after delta 8 ingestion. She noticed differences in him last night were he seemed more irritable and agitated, randomly yelling or getting upset. However he calmed down, went to sleep, and this morning seemed fine. It was after breakfast he started acting strange again, randomly yelling "fuck yes I am tripping" and continued to be hyperactive and yell random things. She confirms he never threatened any family members or made any suicidal gestures or statements. She has no safety concerns at this time. She mostly wanted him to be evaluated to ensure it was substance induced and nothing more serious  due to his psychiatric hx of depression and anxiety.   Ultimately I do believe this is substance induced psychosis considering these symptoms started shortly after ingestion of Delta 8. I did speak to mother that it would be important for patient to return to outpatient psychiatric follow up to restart antidepressant and to continue to monitor improvement of symptoms. Pt was calm and cooperative upon my  assessment, appears he is responding well to IV fluids and low dose antipsychotic. Did not appear to be RTIS, psychotic, or manic. Pt assured me he will not use any delta 8 or 9 products again. Will psychiatrically clear patient.   Past Psychiatric History:  MDD and GAD  Risk to Self or Others: Is the patient at risk to self? No Has the patient been a risk to self in the past 6 months? No Has the patient been a risk to self within the distant past? No Is the patient a risk to others? No Has the patient been a risk to others in the past 6 months? No Has the patient been a risk to others within the distant past? No  [place] Scale:  Flowsheet Row ED from 08/09/2022 in [facility] EMERGENCY DEPARTMENT ED from 04/13/2022 in [facility] EMERGENCY DEPARTMENT Video Visit from 05/14/2021 in [facility]  C-SSRS RISK CATEGORY No Risk Low Risk Moderate Risk       Past Medical History:  Past Medical History:  Diagnosis Date   Anxiety    Depression    Suicidal behavior     Past Surgical History:  Procedure Laterality Date   No prior surgery     Family History:  Family History  Problem Relation Age of Onset   Healthy Mother    Depression Mother    Healthy Father    Social History:  Social History   Substance and Sexual Activity  Alcohol Use Yes   Comment: occ     Social History   Substance and Sexual Activity  Drug Use Never    Social History   Socioeconomic History   Marital status: Single    Spouse name: Not on file   Number of children: 0   Years of education: Not on file   Highest education level: High school graduate  Occupational History   Not on file  Tobacco Use   Smoking status: Never   Smokeless tobacco: Never  Vaping Use   Vaping Use: Never used  Substance and Sexual Activity   Alcohol use: Yes    Comment: occ   Drug use: Never   Sexual activity: Not Currently  Other Topics Concern   Not on  file  Social History Narrative   Not on file   Social Determinants of Health   Financial Resource Strain: Not on file  Food Insecurity: Not on file  Transportation Needs: Not on file  Physical Activity: Not on file  Stress: Not on file  Social Connections: Not on file   Additional Social History:    Allergies:  No Known Allergies  Labs:  Results for orders placed or performed during the hospital encounter of 08/09/22 (from the past 48 hour(s))  Resp Panel by RT-PCR (Flu A&B, Covid) Anterior Nasal Swab     Status: None   Collection Time: 08/09/22  8:42 AM   Specimen: Anterior Nasal Swab  Result Value Ref Range   SARS Coronavirus 2 by RT PCR NEGATIVE NEGATIVE    Comment: (NOTE) SARS-CoV-2 target nucleic acids  are NOT DETECTED.  The SARS-CoV-2 RNA is generally detectable in upper respiratory specimens during the acute phase of infection. The lowest concentration of SARS-CoV-2 viral copies this assay can detect is 138 copies/mL. A negative result does not preclude SARS-Cov-2 infection and should not be used as the sole basis for treatment or other patient management decisions. A negative result may occur with  improper specimen collection/handling, submission of specimen other than nasopharyngeal swab, presence of viral mutation(s) within the areas targeted by this assay, and inadequate number of viral copies(<138 copies/mL). A negative result must be combined with clinical observations, patient history, and epidemiological information. The expected result is Negative.  Fact Sheet for Patients:  EntrepreneurPulse.com.au  Fact Sheet for Healthcare Providers:  IncredibleEmployment.be  This test is no t yet approved or cleared by the [place] FDA and  has been authorized for detection and/or diagnosis of SARS-CoV-2 by FDA under an Emergency Use Authorization (EUA). This EUA will remain  in effect (meaning this test can be used) for  the duration of the COVID-19 declaration under Section 564(b)(1) of the Act, 21 U.S.C.section 360bbb-3(b)(1), unless the authorization is terminated  or revoked sooner.       Influenza A by PCR NEGATIVE NEGATIVE   Influenza B by PCR NEGATIVE NEGATIVE    Comment: (NOTE) The Xpert Xpress SARS-CoV-2/FLU/RSV plus assay is intended as an aid in the diagnosis of influenza from Nasopharyngeal swab specimens and should not be used as a sole basis for treatment. Nasal washings and aspirates are unacceptable for Xpert Xpress SARS-CoV-2/FLU/RSV testing.  Fact Sheet for Patients: EntrepreneurPulse.com.au  Fact Sheet for Healthcare Providers: IncredibleEmployment.be  This test is not yet approved or cleared by the [place] FDA and has been authorized for detection and/or diagnosis of SARS-CoV-2 by FDA under an Emergency Use Authorization (EUA). This EUA will remain in effect (meaning this test can be used) for the duration of the COVID-19 declaration under Section 564(b)(1) of the Act, 21 U.S.C. section 360bbb-3(b)(1), unless the authorization is terminated or revoked.  Performed at [facility], [place] [address], [place], [place] [address]   Comprehensive metabolic panel     Status: Abnormal   Collection Time: 08/09/22  9:38 AM  Result Value Ref Range   Sodium 140 135 - 145 mmol/L   Potassium 3.5 3.5 - 5.1 mmol/L    Comment: HEMOLYSIS AT THIS LEVEL MAY AFFECT RESULT   Chloride 102 98 - 111 mmol/L   CO2 22 22 - 32 mmol/L   Glucose, Bld 129 (H) 70 - 99 mg/dL    Comment: Glucose reference range applies only to samples taken after fasting for at least 8 hours.   BUN 9 6 - 20 mg/dL   Creatinine, Ser 1.07 0.61 - 1.24 mg/dL   Calcium 9.7 8.9 - 10.3 mg/dL   Total Protein 6.6 6.5 - 8.1 g/dL   Albumin 4.0 3.5 - 5.0 g/dL   AST 33 15 - 41 U/L    Comment: HEMOLYSIS AT THIS LEVEL MAY AFFECT RESULT   ALT 23 0 - 44 U/L    Comment: HEMOLYSIS AT THIS  LEVEL MAY AFFECT RESULT   Alkaline Phosphatase 47 38 - 126 U/L   Total Bilirubin 0.8 0.3 - 1.2 mg/dL    Comment: HEMOLYSIS AT THIS LEVEL MAY AFFECT RESULT   GFR, Estimated >60 >60 mL/min    Comment: (NOTE) Calculated using the CKD-EPI Creatinine Equation (2021)    Anion gap 16 (H) 5 - 15  Comment: Performed at [facility], [address] [place], [place] [address]  Ethanol     Status: None   Collection Time: 08/09/22  9:38 AM  Result Value Ref Range   Alcohol, Ethyl (B) <10 <10 mg/dL    Comment: (NOTE) Lowest detectable limit for serum alcohol is 10 mg/dL.  For medical purposes only. Performed at [facility], [address], [place], [place] [address]   CBC with Diff     Status: Abnormal   Collection Time: 08/09/22  9:38 AM  Result Value Ref Range   WBC 15.0 (H) 4.0 - 10.5 K/uL   RBC 5.06 4.22 - 5.81 MIL/uL   Hemoglobin 14.9 13.0 - 17.0 g/dL   HCT 39.5 32.0 - 23.3 %   MCV 84.4 80.0 - 100.0 fL   MCH 29.4 26.0 - 34.0 pg   MCHC 34.9 30.0 - 36.0 g/dL   RDW 43.5 68.6 - 16.8 %   Platelets 399 150 - 400 K/uL   nRBC 0.0 0.0 - 0.2 %   Neutrophils Relative % 85 %   Neutro Abs 12.7 (H) 1.7 - 7.7 K/uL   Lymphocytes Relative 7 %   Lymphs Abs 1.0 0.7 - 4.0 K/uL   Monocytes Relative 8 %   Monocytes Absolute 1.2 (H) 0.1 - 1.0 K/uL   Eosinophils Relative 0 %   Eosinophils Absolute 0.0 0.0 - 0.5 K/uL   Basophils Relative 0 %   Basophils Absolute 0.0 0.0 - 0.1 K/uL   Immature Granulocytes 0 %   Abs Immature Granulocytes 0.06 0.00 - 0.07 K/uL    Comment: Performed at [facility], [address], [place], [place] [address]  Magnesium     Status: None   Collection Time: 08/09/22  9:38 AM  Result Value Ref Range   Magnesium 2.0 1.7 - 2.4 mg/dL    Comment: Performed at [facility], [address] [place], [place] [address]  Salicylate level     Status: Abnormal   Collection Time: 08/09/22  9:38 AM  Result Value Ref Range   Salicylate Lvl <7.0 (L) 7.0 -  30.0 mg/dL    Comment: Performed at [facility], [address] [place], [place] [address]  Acetaminophen level     Status: Abnormal   Collection Time: 08/09/22  9:38 AM  Result Value Ref Range   Acetaminophen (Tylenol), Serum <10 (L) 10 - 30 ug/mL    Comment: (NOTE) Therapeutic concentrations vary significantly. A range of 10-30 ug/mL  may be an effective concentration for many patients. However, some  are best treated at concentrations outside of this range. Acetaminophen concentrations >150 ug/mL at 4 hours after ingestion  and >50 ug/mL at 12 hours after ingestion are often associated with  toxic reactions.  Performed at [facility], [address], [place], [place] [address]   CK     Status: Abnormal   Collection Time: 08/09/22  9:38 AM  Result Value Ref Range   Total CK 441 (H) 49 - 397 U/L    Comment: HEMOLYSIS AT THIS LEVEL MAY AFFECT RESULT Performed at [facility], [address] [place], [place] [address]   Urine rapid drug screen (hosp performed)     Status: Abnormal   Collection Time: 08/09/22  9:39 AM  Result Value Ref Range   Opiates NONE DETECTED NONE DETECTED   Cocaine NONE DETECTED NONE DETECTED   Benzodiazepines NONE DETECTED NONE DETECTED  Amphetamines NONE DETECTED NONE DETECTED   Tetrahydrocannabinol POSITIVE (A) NONE DETECTED   Barbiturates NONE DETECTED NONE DETECTED    Comment: (NOTE) DRUG SCREEN FOR MEDICAL PURPOSES ONLY.  IF CONFIRMATION IS NEEDED FOR ANY PURPOSE, NOTIFY LAB WITHIN 5 DAYS.  LOWEST DETECTABLE LIMITS FOR URINE DRUG SCREEN Drug Class                     Cutoff (ng/mL) Amphetamine and metabolites    1000 Barbiturate and metabolites    200 Benzodiazepine                 200 Opiates and metabolites        300 Cocaine and metabolites        300 THC                            50 Performed at [facility], [place] [address] [place], [place] [address]   Urinalysis, Routine w reflex microscopic     Status:  Abnormal   Collection Time: 08/09/22  9:39 AM  Result Value Ref Range   Color, Urine YELLOW YELLOW   APPearance CLEAR CLEAR   Specific Gravity, Urine 1.010 1.005 - 1.030   pH 6.5 5.0 - 8.0   Glucose, UA NEGATIVE NEGATIVE mg/dL   Hgb urine dipstick NEGATIVE NEGATIVE   Bilirubin Urine NEGATIVE NEGATIVE   Ketones, ur NEGATIVE NEGATIVE mg/dL   Protein, ur 30 (A) NEGATIVE mg/dL   Nitrite NEGATIVE NEGATIVE   Leukocytes,Ua NEGATIVE NEGATIVE    Comment: Performed at [place] [address] [place], [place] [address]  Urinalysis, Microscopic (reflex)     Status: None   Collection Time: 08/09/22  9:39 AM  Result Value Ref Range   RBC / HPF 0-5 0 - 5 RBC/hpf   WBC, UA 0-5 0 - 5 WBC/hpf   Bacteria, UA NONE SEEN NONE SEEN   Squamous Epithelial / LPF NONE SEEN 0 - 5   Mucus PRESENT     Comment: Performed at [facility], [place] [address], [place], [place] [address]    Current Facility-Administered Medications  Medication Dose Route Frequency Provider Last Rate Last Admin   LORazepam (ATIVAN) injection 1 mg  1 mg Intravenous Once PRN Godfrey Pick, MD       Current Outpatient Medications  Medication Sig Dispense Refill   Multiple Vitamins-Iron (DAILY MULTIVITAMINS/IRON PO) Take 1 tablet by mouth daily.     escitalopram (LEXAPRO) 10 MG tablet Take 1 tablet (10 mg total) by mouth daily. (Patient not taking: Reported on 08/09/2022) 30 tablet 1   traZODone (DESYREL) 50 MG tablet Take 1 tablet (50 mg total) by mouth at bedtime as needed for sleep. (Patient not taking: Reported on 08/09/2022) 30 tablet 1   Psychiatric Specialty Exam: Presentation  General Appearance:  Fairly Groomed  Eye Contact: Fair  Speech: Clear and Coherent  Speech Volume: Normal  Handedness:No data recorded  Mood and Affect  Mood: Euthymic  Affect: Congruent   Thought Process  Thought Processes: Coherent  Descriptions of Associations:Intact  Orientation:Full (Time, Place and  Person)  Thought Content:Logical  History of Schizophrenia/Schizoaffective disorder:No data recorded Duration of Psychotic Symptoms:No data recorded Hallucinations:Hallucinations: None  Ideas of Reference:None  Suicidal Thoughts:Suicidal Thoughts: No  Homicidal Thoughts:Homicidal Thoughts: No   Sensorium  Memory: Immediate Fair; Recent Fair  Judgment: Fair  Insight: Fair   Community education officer  Concentration: Fair  Attention  Span: Fair  Recall: [employer] of Knowledge: Fair  Language: Fair   Psychomotor Activity  Psychomotor Activity: Psychomotor Activity: Normal   Assets  Assets: Leisure Time; Physical Health; Resilience    Sleep  Sleep: Sleep: Fair   Physical Exam: Physical Exam Neurological:     Mental Status: He is alert and oriented to person, place, and time.  Psychiatric:        Attention and Perception: Attention normal.        Mood and Affect: Mood is anxious.        Speech: Speech normal.        Behavior: Behavior is cooperative.        Thought Content: Thought content normal.    Review of Systems  Psychiatric/Behavioral:  Positive for substance abuse.        Erratic bizarre behavior after Delta 8 consumption   Blood pressure 105/62, pulse 80, temperature 99.1 F (37.3 C), temperature source Oral, resp. rate 18, SpO2 98 %. There is no height or weight on file to calculate BMI.  Medical Decision Making: Pt case reviewed and discussed with Dr. Dwyane Dee. Pt does not meet criteria for IVC or inpatient psychiatric treatment. Discussed with pt and mother importance of OP follow up and to continue to monitor improvement of symptoms. Pt and mother have no safety concerns at this time. Pt is psychiatrically cleared.   Disposition: No evidence of imminent risk to self or others at present.   Patient does not meet criteria for psychiatric inpatient admission. Supportive therapy provided about ongoing stressors. Discussed crisis plan,  support from social network, calling 911, coming to the Emergency Department, and calling Suicide Hotline.  Vesta Mixer, NP 08/09/2022 3:56 PM

## 2022-08-09 NOTE — ED Notes (Signed)
Patient refusing d/c VS at this time. Patient's mother present at bedside to take patient home and stating will f/u outpatient

## 2022-08-15 ENCOUNTER — Encounter (INDEPENDENT_AMBULATORY_CARE_PROVIDER_SITE_OTHER): Payer: Self-pay | Admitting: Primary Care

## 2022-08-15 ENCOUNTER — Encounter (HOSPITAL_COMMUNITY): Payer: Self-pay | Admitting: Psychiatry

## 2022-08-15 ENCOUNTER — Ambulatory Visit (INDEPENDENT_AMBULATORY_CARE_PROVIDER_SITE_OTHER): Payer: Commercial Managed Care - HMO | Admitting: Primary Care

## 2022-08-15 ENCOUNTER — Ambulatory Visit (INDEPENDENT_AMBULATORY_CARE_PROVIDER_SITE_OTHER): Payer: Commercial Managed Care - HMO | Admitting: Psychiatry

## 2022-08-15 ENCOUNTER — Other Ambulatory Visit: Payer: Self-pay

## 2022-08-15 VITALS — BP 113/74 | HR 78 | Temp 97.9°F | Resp 16 | Ht 71.0 in | Wt 271.0 lb

## 2022-08-15 DIAGNOSIS — M2550 Pain in unspecified joint: Secondary | ICD-10-CM

## 2022-08-15 DIAGNOSIS — G4700 Insomnia, unspecified: Secondary | ICD-10-CM

## 2022-08-15 DIAGNOSIS — F331 Major depressive disorder, recurrent, moderate: Secondary | ICD-10-CM | POA: Diagnosis not present

## 2022-08-15 DIAGNOSIS — Z7689 Persons encountering health services in other specified circumstances: Secondary | ICD-10-CM

## 2022-08-15 DIAGNOSIS — Z23 Encounter for immunization: Secondary | ICD-10-CM

## 2022-08-15 DIAGNOSIS — Z114 Encounter for screening for human immunodeficiency virus [HIV]: Secondary | ICD-10-CM | POA: Diagnosis not present

## 2022-08-15 DIAGNOSIS — F411 Generalized anxiety disorder: Secondary | ICD-10-CM

## 2022-08-15 DIAGNOSIS — F332 Major depressive disorder, recurrent severe without psychotic features: Secondary | ICD-10-CM

## 2022-08-15 DIAGNOSIS — E6609 Other obesity due to excess calories: Secondary | ICD-10-CM

## 2022-08-15 DIAGNOSIS — Z1159 Encounter for screening for other viral diseases: Secondary | ICD-10-CM | POA: Diagnosis not present

## 2022-08-15 DIAGNOSIS — Z6837 Body mass index (BMI) 37.0-37.9, adult: Secondary | ICD-10-CM | POA: Diagnosis not present

## 2022-08-15 MED ORDER — ESCITALOPRAM OXALATE 10 MG PO TABS
10.0000 mg | ORAL_TABLET | Freq: Every day | ORAL | 3 refills | Status: DC
  Filled 2022-08-15 – 2022-12-28 (×2): qty 30, 30d supply, fill #0

## 2022-08-15 MED ORDER — TRAZODONE HCL 50 MG PO TABS
50.0000 mg | ORAL_TABLET | Freq: Every evening | ORAL | 1 refills | Status: DC | PRN
  Filled 2022-08-15 – 2022-12-28 (×2): qty 30, 30d supply, fill #0

## 2022-08-15 MED ORDER — HYDROXYZINE HCL 10 MG PO TABS
10.0000 mg | ORAL_TABLET | Freq: Three times a day (TID) | ORAL | 3 refills | Status: DC | PRN
  Filled 2022-08-15 – 2022-12-28 (×2): qty 90, 30d supply, fill #0
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
## 2022-08-15 NOTE — Progress Notes (Signed)
Generalized pain all over

## 2022-08-15 NOTE — Progress Notes (Signed)
New Patient Office Visit  Subjective    Patient ID: Riley Smith, male    DOB: 06-12-2001  Age: 21 y.o. MRN: [id number]  CC:  Chief Complaint  Patient presents with   Establish Care    HPI Riley Smith is a 21 year old obese male presents to establish care. His main concern is joint pain and able to pop(left elbow locks)  OTC ibuprofen and/or tylenol helps a little. Aggravating factors unknown other then the cold weather . Pain 2/10 worst 8/10 . Followed by behavioral health for depressive disorders. No suicidal ideation or harm to others. Patient has No headache, No chest pain, No abdominal pain - No Nausea, No new weakness tingling or numbness, No Cough - shortness of breath . He is sexually active with one woman and uses birth control.   Outpatient Encounter Medications as of 08/15/2022  Medication Sig   escitalopram (LEXAPRO) 10 MG tablet Take 1 tablet (10 mg total) by mouth daily.   hydrOXYzine (ATARAX) 10 MG tablet Take 1 tablet (10 mg total) by mouth 3 (three) times daily as needed.   Multiple Vitamins-Iron (DAILY MULTIVITAMINS/IRON PO) Take 1 tablet by mouth daily.   traZODone (DESYREL) 50 MG tablet Take 1 tablet (50 mg total) by mouth at bedtime as needed for sleep.   No facility-administered encounter medications on file as of 08/15/2022.    Past Medical History:  Diagnosis Date   Anxiety    Depression    Suicidal behavior     Past Surgical History:  Procedure Laterality Date   No prior surgery      Family History  Problem Relation Age of Onset   Healthy Mother    Depression Mother    Healthy Father     Social History   Socioeconomic History   Marital status: Single    Spouse name: Not on file   Number of children: 0   Years of education: Not on file   Highest education level: High school graduate  Occupational History   Not on file  Tobacco Use   Smoking status: Never   Smokeless tobacco: Never  Vaping Use   Vaping Use: Never used   Substance and Sexual Activity   Alcohol use: Yes    Comment: occ   Drug use: Never   Sexual activity: Not Currently  Other Topics Concern   Not on file  Social History Narrative   Not on file   Social Determinants of Health   Financial Resource Strain: Not on file  Food Insecurity: Food Insecurity Present (08/15/2022)   Hunger Vital Sign    Worried About Running Out of Food in the Last Year: Sometimes true    Ran Out of Food in the Last Year: Sometimes true  Transportation Needs: Not on file  Physical Activity: Inactive (08/15/2022)   Exercise Vital Sign    Days of Exercise per Week: 0 days    Minutes of Exercise per Session: 0 min  Stress: Not on file  Social Connections: Socially Isolated (08/15/2022)   Social Connection and Isolation Panel [NHANES]    Frequency of Communication with Friends and Family: More than three times a week    Frequency of Social Gatherings with Friends and Family: More than three times a week    Attends Religious Services: Never    Database administrator or Organizations: No    Attends Banker Meetings: Never    Marital Status: Never married  Intimate Partner Violence: Not  At Risk (08/15/2022)   Humiliation, Afraid, Rape, and Kick questionnaire    Fear of Current or Ex-Partner: No    Emotionally Abused: No    Physically Abused: No    Sexually Abused: No    ROS Comprehensive ROS Pertinent positive and negative noted in HPI     Objective    Blood Pressure 113/74 (BP Location: Left Arm, Patient Position: Sitting, Cuff Size: Large)   Pulse 78   Temperature 97.9 F (36.6 C) (Oral)   Respiration 16   Height 5\' 11"  (1.803 m)   Weight 271 lb (122.9 kg)   Oxygen Saturation 96%   Body Mass Index 37.80 kg/m   Physical exam: General: Vital signs reviewed.  Patient is well-developed and well-nourished, obese in no acute distress and cooperative with exam. Head: Normocephalic and atraumatic. Eyes: EOMI, conjunctivae normal, no scleral  icterus. Neck: Supple, trachea midline, normal ROM, no JVD, masses, thyromegaly, or carotid bruit present. Cardiovascular: RRR, S1 normal, S2 normal, no murmurs, gallops, or rubs. Pulmonary/Chest: Clear to auscultation bilaterally, no wheezes, rales, or rhonchi. Abdominal: Soft, non-tender, non-distended, BS +, no masses, organomegaly, or guarding present. Musculoskeletal: No joint deformities, erythema, or stiffness, ROM full and nontender. Extremities: No lower extremity edema bilaterally,  pulses symmetric and intact bilaterally. No cyanosis or clubbing. Neurological: A&O x3, Strength is normal Skin: Warm, dry and intact. No rashes or erythema. Psychiatric: Normal mood and affect. speech and behavior is normal. Cognition and memory are normal.  Assessment & Plan:  Riley Smith was seen today for establish care.  Diagnoses and all orders for this visit:  Encounter to establish care  Pain in joints -     Ambulatory referral to Orthopedic Surgery  Severe episode of recurrent major depressive disorder, without psychotic features ([place]) Followed outside of practice   Class 2 obesity due to excess calories without serious comorbidity with body mass index (BMI) of 37.0 to 37.9 in adult Obesity is 30-39 indicating an excess in caloric intake or underlining conditions. This may lead to other co-morbidities. Educated on lifestyle modifications of diet and exercise which may reduce obesity.   Need for HPV vaccine Given   Need for prophylactic vaccination and inoculation against influenza Given   Encounter for screening for HIV Labs  Encounter for HCV screening test for low risk patient labs   Return for schedule for next HPV .   Riley Perna, NP

## 2022-08-17 LAB — HCV INTERPRETATION

## 2022-08-17 LAB — HCV AB W REFLEX TO QUANT PCR: HCV Ab: NONREACTIVE

## 2022-08-17 LAB — HIV ANTIBODY (ROUTINE TESTING W REFLEX): HIV Screen 4th Generation wRfx: NONREACTIVE

## 2022-08-19 ENCOUNTER — Other Ambulatory Visit: Payer: Self-pay

## 2022-08-29 ENCOUNTER — Encounter (HOSPITAL_COMMUNITY): Payer: Self-pay

## 2022-08-29 ENCOUNTER — Ambulatory Visit (HOSPITAL_COMMUNITY): Payer: Medicaid Other | Admitting: Mental Health

## 2022-08-29 ENCOUNTER — Telehealth (HOSPITAL_COMMUNITY): Payer: Self-pay | Admitting: Mental Health

## 2022-08-29 NOTE — Telephone Encounter (Signed)
Therapist sent link for pt to connect to [facility] for intake tele-assessment. No response after x 10 minutes. Contacted number in chart. No answer; left message to reschedule

## 2022-10-24 ENCOUNTER — Encounter (HOSPITAL_COMMUNITY): Payer: Self-pay | Admitting: Psychiatry

## 2022-10-24 ENCOUNTER — Telehealth (INDEPENDENT_AMBULATORY_CARE_PROVIDER_SITE_OTHER): Payer: Medicaid Other | Admitting: Psychiatry

## 2022-10-24 DIAGNOSIS — F411 Generalized anxiety disorder: Secondary | ICD-10-CM

## 2022-10-24 DIAGNOSIS — F32A Depression, unspecified: Secondary | ICD-10-CM

## 2022-10-24 NOTE — Progress Notes (Signed)
BH MD/PA/NP OP Progress Note Virtual Visit via Video Note  I connected with Riley Smith on 10/24/22 at 10:00 AM EST by a video enabled telemedicine application and verified that I am speaking with the correct person using two identifiers.  Location: Patient: Home Provider: Clinic   I discussed the limitations of evaluation and management by telemedicine and the availability of in person appointments. The patient expressed understanding and agreed to proceed.  I provided 30 minutes of non-face-to-face time during this encounter.   10/24/2022 9:42 AM Riley Smith  MRN:  [id number]  Chief Complaint: "I have been stressed"  HPI: 22 year old male seen today for follow up psychiatric evaluation.  He has a psychiatric history of depression, anxiety, cannabis induced psychotic disorder, insomnia, and SI patient is currently managed on Lexapro 10 mg daily, hydroxyzine 10 mg three times daily, and trazodone 50 mg nightly as needed.  He reports that he has not taken these medications in 3 months and does not wish to restart it.   Today he is well-groomed, pleasant, cooperative, and engaged in conversation.  He informed Probation officer that he has been stressed.  He notes that recently he has been living with his girlfriend.  He notes that his girlfriend's children accidentally got into medication and child protective services has taken the children away.  He notes that the children are safe with their biological father's.  He notes that he has been somewhat sad and then depressed because of this.  He also notes that his significant other is also sad which increases his stress.  Overall patient notes that he has been dealing with this stress well and feels mentally stable.  He notes that he has not been on his medications in over 3 months and does not wish to restart them.  Today provider conducted a GAD-7 and patient scored an 8, at his last visit he scored a 4.  Provider also conducted PHQ-9 patient scored an  8, at his last he scored a 7.  He endorses adequate sleep and appetite.  Today he denies SI/HI/VAH, mania, or paranoia.    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  He notes that he is doing well and his coursework.  At this time no medication was restarted.  Patient will follow-up as needed.  No other concerns noted at this time.   Visit Diagnosis: No diagnosis found.  Past Psychiatric History:  Depression, anxiety, cannabis induced psychotic disorder, insomnia, and SI   Past Medical History:  Past Medical History:  Diagnosis Date   Anxiety    Depression    Suicidal behavior     Past Surgical History:  Procedure Laterality Date   No prior surgery      Family Psychiatric History: Mother OCD, anxiety, depression, and SI, Father depression, anxiety, SI, and paternal uncle Autism   Family History:  Family History  Problem Relation Age of Onset   Healthy Mother    Depression Mother    Healthy Father     Social History:  Social History   Socioeconomic History   Marital status: Single    Spouse name: Not on file   Number of children: 0   Years of education: Not on file   Highest education level: High school graduate  Occupational History   Not on file  Tobacco Use   Smoking status: Never   Smokeless tobacco: Never  Vaping Use   Vaping Use: Never used  Substance and Sexual Activity  Alcohol use: Yes    Comment: occ   Drug use: Never   Sexual activity: Not Currently  Other Topics Concern   Not on file  Social History Narrative   Not on file   Social Determinants of Health   Financial Resource Strain: Not on file  Food Insecurity: Food Insecurity Present (08/15/2022)   Hunger Vital Sign    Worried About Running Out of Food in the Last Year: Sometimes true    Ran Out of Food in the Last Year: Sometimes true  Transportation Needs: Not on file  Physical Activity: Inactive (08/15/2022)   Exercise Vital Sign    Days of Exercise per Week: 0  days    Minutes of Exercise per Session: 0 min  Stress: Not on file  Social Connections: Socially Isolated (08/15/2022)   Social Connection and Isolation Panel [NHANES]    Frequency of Communication with Friends and Family: More than three times a week    Frequency of Social Gatherings with Friends and Family: More than three times a week    Attends Religious Services: Never    Marine scientist or Organizations: No    Attends Archivist Meetings: Never    Marital Status: Never married    Allergies: No Known Allergies  Metabolic Disorder Labs: Lab Results  Component Value Date   HGBA1C 5.2 07/02/2020   MPG 103 07/02/2020   No results found for: "PROLACTIN" Lab Results  Component Value Date   CHOL 184 07/02/2020   TRIG 207 (H) 07/02/2020   HDL 44 07/02/2020   CHOLHDL 4.2 07/02/2020   VLDL 41 (H) 07/02/2020   [place] 99 07/02/2020   Lab Results  Component Value Date   TSH 4.570 (H) 07/02/2020    Therapeutic Level Labs: No results found for: "LITHIUM" No results found for: "VALPROATE" No results found for: "CBMZ"  Current Medications: Current Outpatient Medications  Medication Sig Dispense Refill   escitalopram (LEXAPRO) 10 MG tablet Take 1 tablet (10 mg total) by mouth daily. 30 tablet 3   hydrOXYzine (ATARAX) 10 MG tablet Take 1 tablet (10 mg total) by mouth 3 (three) times daily as needed. 90 tablet 3   Multiple Vitamins-Iron (DAILY MULTIVITAMINS/IRON PO) Take 1 tablet by mouth daily.     traZODone (DESYREL) 50 MG tablet Take 1 tablet (50 mg total) by mouth at bedtime as needed for sleep. 30 tablet 1   No current facility-administered medications for this visit.     Musculoskeletal: Strength & Muscle Tone: within normal limits and telehealth visit Gait & Station: normal, telehealth visit Patient leans: N/A  Psychiatric Specialty Exam: Review of Systems  There were no vitals taken for this visit.There is no height or weight on file to calculate  BMI.  General Appearance: Well Groomed  Eye Contact:  Good  Speech:  Clear and Coherent and Normal Rate  Volume:  Normal  Mood:  Euthymic  Affect:  Appropriate and Congruent  Thought Process:  Coherent, Goal Directed, and Linear  Orientation:  Full (Time, Place, and Person)  Thought Content: WDL and Logical   Suicidal Thoughts:  No  Homicidal Thoughts:  No  Memory:  Immediate;   Good Recent;   Good Remote;   Good  Judgement:  Good  Insight:  Good  Psychomotor Activity:  Normal  Concentration:  Concentration: Good and Attention Span: Good  Recall:  Good  Fund of Knowledge: Good  Language: Good  Akathisia:  No  Handed:  Right  AIMS (if  indicated): not done  Assets:  Communication Skills Desire for Improvement Financial Resources/Insurance [place] Talents/Skills Vocational/Educational  ADL's:  Intact  Cognition: WNL  Sleep:  Good   Screenings: AIMS    Flowsheet Row Admission (Discharged) from 07/02/2020 in [place] [facility]  AIMS Total Score 0      AUDIT    Flowsheet Row Admission (Discharged) from 07/02/2020 in [facility]  Alcohol Use Disorder Identification Test Final Score (AUDIT) 2      GAD-7    Flowsheet Row Video Visit from 10/24/2022 in [facility] Most recent reading at 10/24/2022  9:37 AM Office Visit from 08/15/2022 in [place] Most recent reading at 08/15/2022  2:06 PM Office Visit from 08/15/2022 in [facility] Most recent reading at 08/15/2022 10:13 AM Office Visit from 05/20/2021 in [place] [facility] Most recent reading at 05/20/2021 12:38 PM Video Visit from 05/14/2021 in [facility] Most recent reading at 05/14/2021  3:18 PM  Total GAD-7 Score 8 4 8 9 17      $ PHQ2-9    Flowsheet Row Video Visit from 10/24/2022 in [facility] Most recent reading at 10/24/2022  9:36 AM Office Visit from 08/15/2022 in [place] Most recent reading at 08/15/2022  2:06 PM Office Visit from 08/15/2022 in [facility] Most recent reading at 08/15/2022 10:10 AM Office Visit from 05/20/2021 in [place] [facility] Most recent reading at 05/20/2021 12:37 PM Video Visit from 05/14/2021 in [facility] Most recent reading at 05/14/2021  3:16 PM  PHQ-2 Total Score 3 2 2 4 4  $ PHQ-9 Total Score 8 -- 7 17 17      $ Flowsheet Row Video Visit from 10/24/2022 in [facility] ED from 08/09/2022 in [facility] Emergency Department at [facility] ED from 04/13/2022 in [facility] Emergency Department at [place] Error: Q3, 4, or 5 should not be populated when Q2 is No No Risk Low Risk        Assessment and Plan: Patient reports that he has situational stressors but reports that he is able to cope with that.  At this time he does not wish to restart his medications.  Patient will follow-up as needed.  1. Generalized anxiety disorder   2. Mild depression   Collaboration of Care: Collaboration of Care: Other provider involved in patient's care AEB PCP  Patient/Guardian was advised Release of Information must be obtained prior to any record release in order to collaborate their care with an outside provider. Patient/Guardian was advised if they have not already done so to contact the registration department to sign all necessary forms in order for [place] to release information regarding their care.   Consent: Patient/Guardian gives verbal consent for treatment and assignment of benefits for services provided during this visit. Patient/Guardian expressed understanding and agreed to proceed.   Follow-up as needed. Salley Slaughter, NP 10/24/2022, 9:42 AM

## 2022-11-18 ENCOUNTER — Other Ambulatory Visit (INDEPENDENT_AMBULATORY_CARE_PROVIDER_SITE_OTHER): Payer: Self-pay | Admitting: Primary Care

## 2022-11-18 DIAGNOSIS — F331 Major depressive disorder, recurrent, moderate: Secondary | ICD-10-CM

## 2022-11-18 DIAGNOSIS — F411 Generalized anxiety disorder: Secondary | ICD-10-CM

## 2022-11-18 NOTE — Telephone Encounter (Signed)
Requested medication (s) are due for refill today: no  Requested medication (s) are on the active medication list: yes  Last refill:  08/15/22  Future visit scheduled: no  Notes to clinic:  Unable to refill per protocol, last refill by another provider. Routing for approval.     Requested Prescriptions  Pending Prescriptions Disp Refills   escitalopram (LEXAPRO) 10 MG tablet 30 tablet 3    Sig: Take 1 tablet (10 mg total) by mouth daily.     Psychiatry:  Antidepressants - SSRI Passed - 11/18/2022  1:41 PM      Passed - Completed PHQ-2 or PHQ-9 in the last 360 days      Passed - Valid encounter within last 6 months    Recent Outpatient Visits           3 months ago Encounter to establish care   [place] Kerin Perna, NP               hydrOXYzine (ATARAX) 10 MG tablet 90 tablet 3    Sig: Take 1 tablet (10 mg total) by mouth 3 (three) times daily as needed.     Ear, Nose, and Throat:  Antihistamines 2 Passed - 11/18/2022  1:41 PM      Passed - Cr in normal range and within 360 days    Creatinine, Ser  Date Value Ref Range Status  08/09/2022 1.07 0.61 - 1.24 mg/dL Final         Passed - Valid encounter within last 12 months    Recent Outpatient Visits           3 months ago Encounter to establish care   [place] Kerin Perna, NP

## 2022-11-18 NOTE — Telephone Encounter (Signed)
Medication Refill - Medication:  Lexapro 10 mg Atarax 10 mg  Has the patient contacted their pharmacy? Yes.   (Agent: If no, request that the patient contact the pharmacy for the refill. If patient does not wish to contact the pharmacy document the reason why and proceed with request.) (Agent: If yes, when and what did the pharmacy advise?)  Preferred Pharmacy (with phone number or street name): [place] [phone]  as the patient been seen for an appointment in the last year OR does the patient have an upcoming appointment? Yes.    Agent: Please be advised that RX refills may take up to 3 business days. We ask that you follow-up with your pharmacy.

## 2022-11-21 ENCOUNTER — Encounter (HOSPITAL_COMMUNITY): Payer: Self-pay

## 2022-11-21 ENCOUNTER — Telehealth (HOSPITAL_COMMUNITY): Payer: BLUE CROSS/BLUE SHIELD | Admitting: Psychiatry

## 2022-11-24 ENCOUNTER — Telehealth (INDEPENDENT_AMBULATORY_CARE_PROVIDER_SITE_OTHER): Payer: Self-pay

## 2022-11-24 NOTE — Transitions of Care (Post Inpatient/ED Visit) (Signed)
   11/24/2022  Name: ARLESS VINEYARD MRN: [id number] DOB: 2000-09-29  Today's TOC FU Call Status: TOC FU Call Complete Date: 11/24/22  Transition Care Management Follow-up Telephone Call Date of Discharge: 11/23/22 Discharge Facility: Other ([place]) Name of Other (Non[facility]) Discharge Facility: [facility] Type of Discharge: Inpatient Admission Primary Inpatient Discharge Diagnosis:: MDD How have you been since you were released from the hospital?: Better Any questions or concerns?: No  Items Reviewed: Did you receive and understand the discharge instructions provided?: Yes Medications obtained and verified?: Yes (Medications Reviewed) Any new allergies since your discharge?: No Dietary orders reviewed?: NA Do you have support at home?: Yes People in Home: significant other  Home Care and Equipment/Supplies: [place] Ordered?: No Any new equipment or medical supplies ordered?: No  Functional Questionnaire: Do you need assistance with bathing/showering or dressing?: No Do you need assistance with meal preparation?: No Do you need assistance with eating?: No Do you have difficulty maintaining continence: No Do you need assistance with getting out of bed/getting out of a chair/moving?: No Do you have difficulty managing or taking your medications?: No  Folllow up appointments reviewed: PCP Follow-up appointment confirmed?: No (specialist) MD Provider Line Number:[phone] Given: Yes [facility] Follow-up appointment confirmed?: Yes Date of Specialist follow-up appointment?: 12/16/22 Follow-Up Specialty Provider:: [place] you need transportation to your follow-up appointment?: No Do you understand care options if your condition(s) worsen?: Yes-patient verbalized understanding    [place] LPN [place] Direct Dial [phone]

## 2022-12-16 ENCOUNTER — Ambulatory Visit (INDEPENDENT_AMBULATORY_CARE_PROVIDER_SITE_OTHER): Payer: BLUE CROSS/BLUE SHIELD | Admitting: Mental Health

## 2022-12-16 ENCOUNTER — Encounter (HOSPITAL_COMMUNITY): Payer: Self-pay

## 2022-12-16 DIAGNOSIS — F332 Major depressive disorder, recurrent severe without psychotic features: Secondary | ICD-10-CM

## 2022-12-16 DIAGNOSIS — F411 Generalized anxiety disorder: Secondary | ICD-10-CM
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
## 2022-12-28 ENCOUNTER — Other Ambulatory Visit: Payer: Self-pay

## 2023-01-10 ENCOUNTER — Encounter (HOSPITAL_COMMUNITY): Payer: BLUE CROSS/BLUE SHIELD | Admitting: Psychiatry

## 2023-01-12 ENCOUNTER — Other Ambulatory Visit: Payer: Self-pay

## 2023-01-12 ENCOUNTER — Encounter (HOSPITAL_COMMUNITY): Payer: Self-pay | Admitting: Psychiatry

## 2023-01-12 ENCOUNTER — Ambulatory Visit (INDEPENDENT_AMBULATORY_CARE_PROVIDER_SITE_OTHER): Payer: BLUE CROSS/BLUE SHIELD | Admitting: Psychiatry

## 2023-01-12 VITALS — BP 100/60 | HR 67 | Temp 98.1°F | Wt 256.0 lb

## 2023-01-12 DIAGNOSIS — F411 Generalized anxiety disorder: Secondary | ICD-10-CM

## 2023-01-12 DIAGNOSIS — F331 Major depressive disorder, recurrent, moderate: Secondary | ICD-10-CM | POA: Diagnosis not present

## 2023-01-12 DIAGNOSIS — F609 Personality disorder, unspecified: Secondary | ICD-10-CM | POA: Diagnosis not present

## 2023-01-12 MED ORDER — HYDROXYZINE HCL 10 MG PO TABS
10.0000 mg | ORAL_TABLET | Freq: Three times a day (TID) | ORAL | 3 refills | Status: DC | PRN
  Filled 2023-01-12: qty 90, 30d supply, fill #0

## 2023-01-12 MED ORDER — ESCITALOPRAM OXALATE 10 MG PO TABS
10.0000 mg | ORAL_TABLET | Freq: Every day | ORAL | 3 refills | Status: DC
Start: 2023-01-12 — End: 2023-01-12
  Filled 2023-01-12: qty 30, 30d supply, fill #0

## 2023-01-12 MED ORDER — HYDROXYZINE HCL 25 MG PO TABS
12.5000 mg | ORAL_TABLET | Freq: Three times a day (TID) | ORAL | 3 refills | Status: DC | PRN
Start: 2023-01-12 — End: 2023-10-16
  Filled 2023-01-12: qty 45, 30d supply, fill #0
  Filled 2023-02-08: qty 90, 60d supply, fill #0
  Filled 2023-04-03 (×2): qty 90, 60d supply, fill #1

## 2023-01-12 MED ORDER — ESCITALOPRAM OXALATE 20 MG PO TABS
20.0000 mg | ORAL_TABLET | Freq: Every day | ORAL | 3 refills | Status: DC
Start: 2023-01-12 — End: 2023-10-16
  Filled 2023-01-12 – 2023-02-08 (×2): qty 30, 30d supply, fill #0
  Filled 2023-04-03 (×2): qty 30, 30d supply, fill #1
  Filled 2023-05-10: qty 30, 30d supply, fill #2
  Filled 2023-06-22: qty 30, 30d supply, fill #3

## 2023-01-12 MED ORDER — ARIPIPRAZOLE 5 MG PO TABS
5.0000 mg | ORAL_TABLET | Freq: Every day | ORAL | 3 refills | Status: DC
  Filled 2023-01-12 – 2023-02-08 (×2): qty 30, 30d supply, fill #0
  Filled 2023-04-03 (×2): qty 30, 30d supply, fill #1
  Filled 2023-05-10: qty 30, 30d supply, fill #2
  Filled 2023-06-22: qty 30, 30d supply, fill #3
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
## 2023-01-16 ENCOUNTER — Ambulatory Visit (INDEPENDENT_AMBULATORY_CARE_PROVIDER_SITE_OTHER): Payer: BLUE CROSS/BLUE SHIELD | Admitting: Mental Health

## 2023-01-16 DIAGNOSIS — F331 Major depressive disorder, recurrent, moderate: Secondary | ICD-10-CM | POA: Diagnosis not present

## 2023-01-16 DIAGNOSIS — F609 Personality disorder, unspecified: Secondary | ICD-10-CM

## 2023-01-16 DIAGNOSIS — F411 Generalized anxiety disorder: Secondary | ICD-10-CM

## 2023-01-16 NOTE — Progress Notes (Signed)
THERAPIST PROGRESS NOTE Virtual Visit via Video Note  I connected with Riley Smith on 01/17/23 at  3:00 PM EDT by a video enabled telemedicine application and verified that I am speaking with the correct person using two identifiers.  Location: Patient: home address on file Provider: home office   I discussed the limitations of evaluation and management by telemedicine and the availability of in person appointments. The patient expressed understanding and agreed to proceed.  I discussed the assessment and treatment plan with the patient. The patient was provided an opportunity to ask questions and all were answered. The patient agreed with the plan and demonstrated an understanding of the instructions.   The patient was advised to call back or seek an in-person evaluation if the symptoms worsen or if the condition fails to improve as anticipated.  I provided 55 minutes of non-face-to-face time during this encounter.   Riley Smith, [facility]   Session Time: 3:05 pm (55  minutes)   Participation Level: Active  Behavioral Response: Fairly GroomedAlertWNL  Type of Therapy: Individual Therapy  Treatment Goals addressed: STG: Riley Smith will increase management of depressive sxs AEB development of x 3 emotional regulation and distress tolerance skills within the next 90 days.   ProgressTowards Goals: Initial  Interventions: CBT and Supportive  Summary: Riley Smith is a 22 y.o. male who presents with dx of Major depressive disorder, moderate; generalized anxiety and personality disorder. Reports chief compliant of feelings of stress and anxiety as well as ongoing depressive sxs. Reports stressors related to passing of grand-mother and for end of relationship t have been finalized with retrieving all of his belongings from now ex-girlfriends home. Notes living with mother and mother has provided him with time line in order to obtain employment and contribute financially to  household. Has been able to obtain employment with local [employer]. 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 Shares can have difficulty communicating with others and can be passive aggressive; easily defensive. Shares at times can be in a good mood and at times to feel as if he is other individuals. Shares personal goals of finishing school, obtaining driver's license.  Engaged with therapist with distorted thinking and identifies with frequent black and white thinking; over generalizing and catastrophic thinking. Agrees to work on increasing awareness of thinking patterns and working to process thoughts and events in helpful manner. Agrees to journaling. Denies SI/HI. Initial work towards goals; sxs unchanged at this time.   Suicidal/Homicidal: Nowithout intent/plan  Therapist Response: Therapist engaged Riley Smith in [employer]. Completed check in; reviewed intake assessment. Reviewed bounds of confidentiality and informed consent. Provided safe space for Riley Smith to share thoughts and concerns regarding current stressors. Provided supportive feedback; validated feelings. Assessed for current coping mechanism, sxs management and level of functioning. Engaged in education on DBT and working to use and practice DBT skills. Engaged in Psychoeducational of connection of thoughts, feelings and behaviors. Explored cognitive distortions and working to increase self-awareness of thoughts and ability to question thoughts. Engaged in processing ways in which maladaptive thinking patterns can effect emotions and behaviors. Reviewed session an encouraged to work to engage in journaling and identifying distorted thoughts. Provided follow up and assessed for safety.   Plan: Return again in  4 weeks.  Diagnosis: Moderate episode of recurrent major depressive disorder (HCC)  Generalized anxiety disorder  Personality disorder in adult  [facility])  Collaboration of Care: Other None  Patient/Guardian was advised Release of Information must be obtained prior to any record release in order to collaborate their care with an outside provider. Patient/Guardian was advised if they have not already done so to contact the registration department to sign all necessary forms in order for [place] to release information regarding their care.   Consent: Patient/Guardian gives verbal consent for treatment and assignment of benefits for services provided during this visit. Patient/Guardian expressed understanding and agreed to proceed.   Riley Smith [place], [facility] 01/17/2023

## 2023-01-19 ENCOUNTER — Other Ambulatory Visit: Payer: Self-pay

## 2023-01-20 ENCOUNTER — Other Ambulatory Visit: Payer: Self-pay

## 2023-01-20 ENCOUNTER — Ambulatory Visit (INDEPENDENT_AMBULATORY_CARE_PROVIDER_SITE_OTHER): Payer: BLUE CROSS/BLUE SHIELD | Admitting: Primary Care

## 2023-01-20 VITALS — BP 99/60 | HR 76 | Temp 98.0°F | Resp 16 | Ht 71.0 in | Wt 254.0 lb

## 2023-01-20 DIAGNOSIS — F331 Major depressive disorder, recurrent, moderate: Secondary | ICD-10-CM

## 2023-01-20 DIAGNOSIS — M2550 Pain in unspecified joint: Secondary | ICD-10-CM

## 2023-01-20 DIAGNOSIS — J029 Acute pharyngitis, unspecified: Secondary | ICD-10-CM | POA: Diagnosis not present

## 2023-01-20 DIAGNOSIS — F411 Generalized anxiety disorder: Secondary | ICD-10-CM

## 2023-01-20 MED ORDER — LORATADINE 10 MG PO TABS
10.0000 mg | ORAL_TABLET | Freq: Every day | ORAL | 11 refills | Status: AC
Start: 2023-01-20 — End: ?
  Filled 2023-01-20: qty 30, 30d supply, fill #0

## 2023-01-20 NOTE — Progress Notes (Signed)
   Acute Office Visit  Subjective:     Patient ID: Riley Smith, male    DOB: 05-31-2001, 22 y.o.   MRN: [id number]  Chief Complaint  Patient presents with   Sore Throat   Joint Pain    HPI Mr. Riley Smith is a 22 year old obese male in today for sore throat for 1 week- rapid strep was negative. He continues to have joint pain. Patient has No headache, No chest pain, No abdominal pain - No Nausea, No new weakness tingling or numbness, No Cough - shortness of breath   ROS Comprehensive ROS Pertinent positive and negative noted in HPI       Objective:    Blood Pressure 99/60 (BP Location: Left Arm, Patient Position: Sitting, Cuff Size: Large)   Pulse 76   Temperature 98 F (36.7 C) (Oral)   Respiration 16   Height 5\' 11"  (1.803 m)   Weight 254 lb (115.2 kg)   Oxygen Saturation 98%   Body Mass Index 35.43 kg/m   Physical Exam General: No apparent distress. Eyes: Extraocular eye movements intact, pupils equal and round. Neck: Supple, trachea midline. Thyroid: No enlargement, mobile without fixation, no tenderness. Cardiovascular: Regular rhythm and rate, no murmur, normal radial pulses. Respiratory: Normal respiratory effort, clear to auscultation. Gastrointestinal: Normal pitch active bowel sounds, nontender abdomen without distention or appreciable hepatomegaly. Musculoskeletal: Normal muscle tone, no tenderness on palpation of tibia, no excessive thoracic kyphosis. Skin: Appropriate warmth, no visible rash. Mental status: Alert, conversant, speech clear, thought logical, appropriate mood and affect, no hallucinations or delusions evident. Hematologic/lymphatic: No cervical adenopathy, no visible ecchymoses.       Assessment & Plan:  Riley Smith was seen today for sore throat and joint pain.  Diagnoses and all orders for this visit:  Sore throat Negative for strep   Generalized anxiety disorder 2/2 Moderate episode of recurrent major depressive disorder  (HCC) Followed outside of practice  Flowsheet Row Office Visit [facility]23 in [facility]  PHQ-9 Total Score 16        Pain in joints Referral made- provided info on AVS  Thank you for trusting the care of your patient to our office. However we have made three unsuccessful attempts to contact the patient, to schedule an appointment. At this time we will close the referral, if an appointment is still needed please have the patient contact our office at [phone] [phone].  Grayce Sessions, NP

## 2023-01-20 NOTE — Progress Notes (Signed)
Joint pain Pain is 7/10 on a bad day. 4/10 on a good day.  Constant discomfort  Generalized pain   Sore throat x 1 week. Does not think he has been febrile but family has had.

## 2023-01-20 NOTE — Patient Instructions (Signed)
  Thank you for trusting the care of your patient to our office. However we have made three unsuccessful attempts to contact the patient, to schedule an appointment. At this time we will close the referral, if an appointment is still needed please have the patient contact our office at [phone].

## 2023-01-27 ENCOUNTER — Other Ambulatory Visit: Payer: Self-pay

## 2023-02-01 ENCOUNTER — Ambulatory Visit (INDEPENDENT_AMBULATORY_CARE_PROVIDER_SITE_OTHER): Payer: BLUE CROSS/BLUE SHIELD | Admitting: Mental Health

## 2023-02-01 DIAGNOSIS — F411 Generalized anxiety disorder: Secondary | ICD-10-CM

## 2023-02-01 DIAGNOSIS — F331 Major depressive disorder, recurrent, moderate: Secondary | ICD-10-CM | POA: Diagnosis not present

## 2023-02-01 DIAGNOSIS — F609 Personality disorder, unspecified: Secondary | ICD-10-CM

## 2023-02-01 NOTE — Progress Notes (Signed)
THERAPIST PROGRESS NOTE Virtual Visit via Video Note  I connected with Riley Smith on 02/01/23 at  3:00 PM EDT by a video enabled telemedicine application and verified that I am speaking with the correct person using two identifiers.  Location: Patient: address on file Provider: office    I discussed the limitations of evaluation and management by telemedicine and the availability of in person appointments. The patient expressed understanding and agreed to proceed.  I discussed the assessment and treatment plan with the patient. The patient was provided an opportunity to ask questions and all were answered. The patient agreed with the plan and demonstrated an understanding of the instructions.   The patient was advised to call back or seek an in-person evaluation if the symptoms worsen or if the condition fails to improve as anticipated.  I provided 54 minutes of non-face-to-face time during this encounter.   Dorris Singh, [facility]   Session Time: 3:04 pm ( 54 minutes)   Participation Level: Active  Behavioral Response: CasualAlertAdequate  Type of Therapy: Individual Therapy  Treatment Goals addressed: STG: Challen will increase management of depressive sxs AEB development of x 3 emotional regulation and distress tolerance skills within the next 90 days.   ProgressTowards Goals: Progressing  Interventions: CBT and Supportive  Summary:  Riley Smith is a 22 y.o. male who presents with dx of Major depressive disorder, moderate; generalized anxiety and personality disorder. Reports chief compliant of feelings of stress and anxiety as well as ongoing depressive sxs. Reports some increase in mood and for things to be starting to get "better." Shares coming to terms with break up with girlfriend and shares with clinician lesson learned from engagement with ex girlfriend. Notes to be working and for mother to no longer be threatening to kick him out of the home. Shares has  been able to reconnect with old friends and to have been spending time with friends. Rates depression 4/10; denies concerns for anxiety and excessive worry at this time. Shares some feelings of fatigue. Notes working to increase awareness of his boundaries and boundaries with others. Notes working to increase emotion management and processing stress in healthy manner. Denies SI/HI. Progress with goals with cognitive coping use to support in emotional regulation and distress tolerance. Notes journaling as coping as well. Sxs stable at this time. No safety concerns.   Suicidal/Homicidal: Nowithout intent/plan  Therapist Response: Therapist engaged Riley Smith in [employer]. Completed check in; reviewed intake assessment. Provided safe space for [name] to share thoughts and concerns regarding current stressors. Provided supportive feedback; validated feelings. Assessed for current coping mechanisms in managing low mood. Explored ability to complete homework and increase emotional regulation with increased awareness of emotions. Provided supportive feedback. Reviewed connection of thoughts feelings and behaviors and working to engage in balance thinking patters. Reviewed session and provided follow up.   Plan: Return again in x 4 weeks.  Diagnosis: Moderate episode of recurrent major depressive disorder (HCC)  Generalized anxiety disorder  Personality disorder in adult [facility])  Collaboration of Care: Other None  Patient/Guardian was advised Release of Information must be obtained prior to any record release in order to collaborate their care with an outside provider. Patient/Guardian was advised if they have not already done so to contact the registration department to sign all necessary forms in order for [place] to release information regarding their care.   Consent: Patient/Guardian gives verbal consent for treatment and assignment of benefits for services provided during this visit. Patient/Guardian  expressed understanding and agreed to proceed.   [name] [place], [facility] 02/01/2023

## 2023-02-08 ENCOUNTER — Other Ambulatory Visit: Payer: Self-pay

## 2023-02-12 ENCOUNTER — Encounter (HOSPITAL_COMMUNITY): Payer: Self-pay

## 2023-02-12 ENCOUNTER — Emergency Department (HOSPITAL_COMMUNITY)
Admission: EM | Admit: 2023-02-12 | Discharge: 2023-02-12 | Disposition: A | Discharge status: Home / Self Care | Payer: BLUE CROSS/BLUE SHIELD | Attending: Emergency Medicine | Admitting: Emergency Medicine

## 2023-02-12 ENCOUNTER — Emergency Department (HOSPITAL_COMMUNITY): Payer: BLUE CROSS/BLUE SHIELD

## 2023-02-12 ENCOUNTER — Other Ambulatory Visit: Payer: Self-pay

## 2023-02-12 DIAGNOSIS — Y92410 Unspecified street and highway as the place of occurrence of the external cause: Secondary | ICD-10-CM | POA: Insufficient documentation

## 2023-02-12 DIAGNOSIS — M25531 Pain in right wrist: Secondary | ICD-10-CM | POA: Diagnosis not present

## 2023-02-12 DIAGNOSIS — R0789 Other chest pain: Secondary | ICD-10-CM | POA: Insufficient documentation

## 2023-02-12 LAB — BASIC METABOLIC PANEL
Anion gap: 7 (ref 5–15)
BUN: 6 mg/dL (ref 6–20)
CO2: 28 mmol/L (ref 22–32)
Calcium: 8.7 mg/dL — ABNORMAL LOW (ref 8.9–10.3)
Chloride: 103 mmol/L (ref 98–111)
Creatinine, Ser: 1.05 mg/dL (ref 0.61–1.24)
GFR, Estimated: >60 mL/min (ref >60)
Glucose, Bld: 87 mg/dL (ref 70–99)
Potassium: 3.5 mmol/L (ref 3.5–5.1)
Sodium: 138 mmol/L (ref 135–145)

## 2023-02-12 LAB — TROPONIN I (HIGH SENSITIVITY): Troponin I (High Sensitivity): 2 ng/L (ref <18)

## 2023-02-12 LAB — CBC
HCT: 44.7 % (ref 39.0–52.0)
Hemoglobin: 15.5 g/dL (ref 13.0–17.0)
MCH: 29.1 pg (ref 26.0–34.0)
MCHC: 34.7 g/dL (ref 30.0–36.0)
MCV: 83.9 fL (ref 80.0–100.0)
Platelets: 375 10*3/uL (ref 150–400)
RBC: 5.33 MIL/uL (ref 4.22–5.81)
RDW: 12.8 % (ref 11.5–15.5)
WBC: 9.6 10*3/uL (ref 4.0–10.5)
nRBC: 0 % (ref 0.0–0.2)
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
## 2023-02-20 ENCOUNTER — Ambulatory Visit (INDEPENDENT_AMBULATORY_CARE_PROVIDER_SITE_OTHER): Payer: BLUE CROSS/BLUE SHIELD | Admitting: Mental Health

## 2023-02-20 DIAGNOSIS — F609 Personality disorder, unspecified: Secondary | ICD-10-CM

## 2023-02-20 DIAGNOSIS — F411 Generalized anxiety disorder: Secondary | ICD-10-CM

## 2023-02-20 DIAGNOSIS — F331 Major depressive disorder, recurrent, moderate: Secondary | ICD-10-CM

## 2023-02-20 NOTE — Progress Notes (Signed)
THERAPIST PROGRESS NOTE Virtual Visit via Video Note  I connected with [name] on 02/20/23 at  3:00 PM EDT by a video enabled telemedicine application and verified that I am speaking with the correct person using two identifiers.  Location: Patient: home address on file Provider: home office   I discussed the limitations of evaluation and management by telemedicine and the availability of in person appointments. The patient expressed understanding and agreed to proceed.  I discussed the assessment and treatment plan with the patient. The patient was provided an opportunity to ask questions and all were answered. The patient agreed with the plan and demonstrated an understanding of the instructions.   The patient was advised to call back or seek an in-person evaluation if the symptoms worsen or if the condition fails to improve as anticipated.  I provided 40 minutes of non-face-to-face time during this encounter.   Dorris Singh, [facility]   Session Time: 3:12 pm   Participation Level: Active  Behavioral Response: CasualAlertEuthymic  Type of Therapy: Individual Therapy  Treatment Goals addressed: STG: Lenward will increase management of depressive sxs AEB development of x 3 emotional regulation and distress tolerance skills within the next 90 days.   ProgressTowards Goals: Progressing  Interventions: Supportive  Summary: CLOIS MCNALL is a 22 y.o. male who presents with dx of Major depressive disorder, moderate; generalized anxiety and personality disorder. Reports chief compliant of feelings of stress and anxiety as well as ongoing depressive sxs. Reports some increase in mood and able to identify engagement in work with possible promotion and engagement with friends to be factors that are contributed to wellness. Decrease in stress with mother reporting to no longer have plans to kick him out of the home. Shares has been able to spend time with father which he has  enjoyed. Shares event in which he was rejected from male friend in which he disclosed to have romantic feelings for. Shares to have been able to write thoughts and feelings down and was able to identify distorted thinking and able to reframe independently and reports was supportive in working through difficulty feelings and ability to maintain friendship relationship with friend. Shares has been journaling to support in regulating emotions. Rates depression 2/10. Shares days in which depression is increased and will watch a funny movie to cope. Shares self-soothing skills of music and inscents. Progress with goals and [place] of appropriate coping skills. Increase management of moods. Denies SI/HI. Sxs stable at this time.    Suicidal/Homicidal: Nowithout intent/plan  Therapist Response: Therapist engaged Zacharius in [employer].  Provided safe space for Garvie to share thoughts and concerns regarding current stressors. Provided supportive feedback; validated feelings. Engaged Brilyn in review of ability to identify distorted thinking and ability to restructure thoughts. Processed benefits of ability to do so and effect on moods and ability to manage difficulty emotions. Explored emotional regulation and distress tolerance skills. Encouraged behavioral activation for coping with mood. Assessed for levels of depression and anxiety and safety concerns. Educated on self-soothing skills. Reviewed session and provided follow up. No safety concerns present.   Plan: Return again in x 4 weeks.  Diagnosis: Moderate episode of recurrent major depressive disorder (HCC)  Generalized anxiety disorder  Personality disorder in adult [facility])  Collaboration of Care: Other None  Patient/Guardian was advised Release of Information must be obtained prior to any record release in order to collaborate their care with an outside provider. Patient/Guardian was advised if they have not already  done so to contact the  registration department to sign all necessary forms in order for [place] to release information regarding their care.   Consent: Patient/Guardian gives verbal consent for treatment and assignment of benefits for services provided during this visit. Patient/Guardian expressed understanding and agreed to proceed.   [name] [place], [facility] 02/20/2023

## 2023-03-06 ENCOUNTER — Ambulatory Visit (HOSPITAL_COMMUNITY): Payer: BLUE CROSS/BLUE SHIELD | Admitting: Mental Health

## 2023-03-06 ENCOUNTER — Encounter (HOSPITAL_COMMUNITY): Payer: Self-pay

## 2023-03-06 ENCOUNTER — Telehealth (HOSPITAL_COMMUNITY): Payer: Self-pay | Admitting: Mental Health

## 2023-03-06 NOTE — Telephone Encounter (Signed)
Therapist sent link for tele-therapy session x 2. No response after x 12 minutes. Contacted pt via telephone, no answer with voicemail box being full; unable to leave message. NS

## 2023-03-15 ENCOUNTER — Encounter (HOSPITAL_COMMUNITY): Payer: Self-pay

## 2023-03-15 ENCOUNTER — Telehealth (HOSPITAL_COMMUNITY): Payer: BLUE CROSS/BLUE SHIELD | Admitting: Psychiatry

## 2023-03-31 ENCOUNTER — Ambulatory Visit (INDEPENDENT_AMBULATORY_CARE_PROVIDER_SITE_OTHER): Payer: BLUE CROSS/BLUE SHIELD | Admitting: Mental Health

## 2023-03-31 DIAGNOSIS — F331 Major depressive disorder, recurrent, moderate: Secondary | ICD-10-CM | POA: Diagnosis not present

## 2023-03-31 DIAGNOSIS — F411 Generalized anxiety disorder: Secondary | ICD-10-CM

## 2023-03-31 DIAGNOSIS — F609 Personality disorder, unspecified: Secondary | ICD-10-CM

## 2023-03-31 NOTE — Progress Notes (Signed)
THERAPIST PROGRESS NOTE Virtual Visit via Video Note  I connected with Riley Smith on 03/31/23 at 10:00 AM EDT by a video enabled telemedicine application and verified that I am speaking with the correct person using two identifiers.  Location: Patient: home address on file Provider: office   I discussed the limitations of evaluation and management by telemedicine and the availability of in person appointments. The patient expressed understanding and agreed to proceed.  I discussed the assessment and treatment plan with the patient. The patient was provided an opportunity to ask questions and all were answered. The patient agreed with the plan and demonstrated an understanding of the instructions.   The patient was advised to call back or seek an in-person evaluation if the symptoms worsen or if the condition fails to improve as anticipated.  I provided 44 minutes of non-face-to-face time during this encounter.   Dorris Singh, [facility]   Session Time: 10: 01 am ( 44 minutes)   Participation Level: Active  Behavioral Response: CasualAlertDysphoric  Type of Therapy: Individual Therapy  Treatment Goals addressed:  STG: Sam will increase management of depressive sxs AEB development of x 3 emotional regulation and distress tolerance skills within the next 90 days.   ProgressTowards Goals: Progressing  Interventions: CBT and Supportive  Summary:Riley Smith is a 22 y.o. male who presents with dx of Major depressive disorder, moderate; generalized anxiety and personality disorder. Reports chief compliant of feelings of low mood and noting "emotional turmoil." Shares for things to be going fair and notes recent low moods related to interactions with ex-girlfriend. Shares with therapist nature of conversations with her and reports for her to say she cares however actions do not indicate so. Engages with therapist to explore thoughts and feelings related to need to obtain  closure and ability to obtain feelings of closure without discussing with her. Identifies opportunities for growth with communication. Reports working to cope with moods and regulate emotions with music, taking time to himself and thinking. Shares concerns for school and in need of altering major. Reports continues to work and spend time with family. Notes would like to incorporate working out into his routine. Denies SI/HI. Progress with goals; sxs stable.   Suicidal/Homicidal: Nowithout intent/plan  Therapist Response: Therapist engaged Yehuda in [employer].  Provided safe space for Barak to share thoughts and concerns regarding current stressors. Provided supportive feedback; validated feelings. Engaged Kenneith in exploring working engage in feelings of acceptance of ending of relationship and ability to follow individuals actions vs. Their words. Supported in processing feelings of closure and exploration of areas of growth. Engaged in education of effective communication styles with others and explored listening to understand vs. Listening to respond and ability to regulate emotions during conflict. Encouraged ongoing cognitive coping and reviewed connection of thoughts, feelings and behaviors. Encouraged to follow up with school and explore ability to incorporate exercise in routine. No safety concerns reported.   Plan: Return again in x 6 weeks.  Diagnosis: Moderate episode of recurrent major depressive disorder (HCC)  Generalized anxiety disorder  Personality disorder in adult [facility])  Collaboration of Care: Other None  Patient/Guardian was advised Release of Information must be obtained prior to any record release in order to collaborate their care with an outside provider. Patient/Guardian was advised if they have not already done so to contact the registration department to sign all necessary forms in order for [place] to release information regarding their care.   Consent:  Patient/Guardian gives  verbal consent for treatment and assignment of benefits for services provided during this visit. Patient/Guardian expressed understanding and agreed to proceed.   [name] [place], [facility] 03/31/2023

## 2023-04-03 ENCOUNTER — Other Ambulatory Visit: Payer: Self-pay

## 2023-04-09 ENCOUNTER — Emergency Department (HOSPITAL_COMMUNITY)
Admission: EM | Admit: 2023-04-09 | Discharge: 2023-04-09 | Disposition: A | Discharge status: Home / Self Care | Payer: BLUE CROSS/BLUE SHIELD | Attending: Emergency Medicine | Admitting: Emergency Medicine

## 2023-04-09 DIAGNOSIS — T40711A Poisoning by cannabis, accidental (unintentional), initial encounter: Secondary | ICD-10-CM | POA: Insufficient documentation

## 2023-04-09 DIAGNOSIS — T6594XA Toxic effect of unspecified substance, undetermined, initial encounter: Secondary | ICD-10-CM

## 2023-04-09 LAB — COMPREHENSIVE METABOLIC PANEL
ALT: 23 U/L (ref 0–44)
AST: 25 U/L (ref 15–41)
Albumin: 4 g/dL (ref 3.5–5.0)
Alkaline Phosphatase: 45 U/L (ref 38–126)
Anion gap: 11 (ref 5–15)
BUN: 6 mg/dL (ref 6–20)
CO2: 21 mmol/L — ABNORMAL LOW (ref 22–32)
Calcium: 8.8 mg/dL — ABNORMAL LOW (ref 8.9–10.3)
Chloride: 105 mmol/L (ref 98–111)
Creatinine, Ser: 1.06 mg/dL (ref 0.61–1.24)
GFR, Estimated: >60 mL/min (ref >60)
Glucose, Bld: 126 mg/dL — ABNORMAL HIGH (ref 70–99)
Potassium: 3.4 mmol/L — ABNORMAL LOW (ref 3.5–5.1)
Sodium: 137 mmol/L (ref 135–145)
Total Bilirubin: 0.9 mg/dL (ref 0.3–1.2)
Total Protein: 6.4 g/dL — ABNORMAL LOW (ref 6.5–8.1)

## 2023-04-09 LAB — RAPID URINE DRUG SCREEN, HOSP PERFORMED
Amphetamines: NOT DETECTED
Barbiturates: NOT DETECTED
Benzodiazepines: POSITIVE — AB
Cocaine: NOT DETECTED
Opiates: NOT DETECTED
Tetrahydrocannabinol: POSITIVE — AB

## 2023-04-09 LAB — URINALYSIS, ROUTINE W REFLEX MICROSCOPIC
Bilirubin Urine: NEGATIVE
Glucose, UA: NEGATIVE mg/dL
Hgb urine dipstick: NEGATIVE
Ketones, ur: NEGATIVE mg/dL
Leukocytes,Ua: NEGATIVE
Nitrite: NEGATIVE
Protein, ur: NEGATIVE mg/dL
Specific Gravity, Urine: 1.001 — ABNORMAL LOW (ref 1.005–1.030)
pH: 6 (ref 5.0–8.0)

## 2023-04-09 LAB — CBC
HCT: 43.7 % (ref 39.0–52.0)
Hemoglobin: 15.1 g/dL (ref 13.0–17.0)
MCH: 29 pg (ref 26.0–34.0)
MCHC: 34.6 g/dL (ref 30.0–36.0)
MCV: 83.9 fL (ref 80.0–100.0)
Platelets: 340 10*3/uL (ref 150–400)
RBC: 5.21 MIL/uL (ref 4.22–5.81)
RDW: 12.4 % (ref 11.5–15.5)
WBC: 13 10*3/uL — ABNORMAL HIGH (ref 4.0–10.5)
nRBC: 0 % (ref 0.0–0.2)

## 2023-04-09 MED ORDER — ONDANSETRON HCL 4 MG/2ML IJ SOLN
4.0000 mg | Freq: Once | INTRAMUSCULAR | Status: AC
  Administered 2023-04-09: 4 mg via INTRAVENOUS
  Filled 2023-04-09: qty 2

## 2023-04-09 MED ORDER — SODIUM CHLORIDE 0.9 % IV BOLUS
1000.0000 mL | Freq: Once | INTRAVENOUS | Status: AC
  Administered 2023-04-09: 1000 mL via INTRAVENOUS

## 2023-04-09 NOTE — ED Notes (Signed)
Patient again observed by this RN, flapping arms and legs in what appeared to be an attempted pseudo seizure. Patient stopped behavior and answered questions when confronted with intelligent discourse associated with behavior. Patient instructed to get back on stretcher and to stop behavior. Security called to hallway outside room where they remained momentarily. Patient cooperated and returned to stretcher.

## 2023-04-09 NOTE — ED Notes (Signed)
Patient able to tolerate oral fluids without vomiting

## 2023-04-09 NOTE — ED Notes (Signed)
Patient observed by this RN and Attending PA, flapping arms and legs in what appeared to be an attempted pseudo seizure. Patient stopped behavior and answered questions when confronted with intelligent discourse associated with behavior.

## 2023-04-09 NOTE — Discharge Instructions (Addendum)
It was a pleasure taking part in your care tonight.  As discussed, your workup is reassuring.  In the future, please discontinue use and/or avoid any marijuana products.  I would like for you to follow-up with your PCP on Monday for reevaluation.  If you have any new or worsening symptoms tonight or this week, please return to the ED for further management and care.

## 2023-04-09 NOTE — ED Provider Notes (Addendum)
[place] EMERGENCY DEPARTMENT AT [facility] Provider Note   CSN: [id number] Arrival date & time: 04/09/23  1940     History  Chief Complaint  Patient presents with   Ingestion    Patient to ED via EMS with complaint of eating delta  9 gummies and having an adverse reaction.Patient reportedly has a history of bipolar and adverse reaction to delta in past.    Riley Smith is a 22 y.o. male with medical history of suicidal behavior, pression, anxiety.  Patient presents to ED for evaluation of adverse reaction to delta 8 marijuana.  The patient states that prior to arrival he was with a friend, Orvilla Fus, and they purchased Gummies that contain delta 8 marijuana from a local "head shop".  The patient is unsure of the name of these Gummies, he is unsure of the dosage or milligram.  He reports that he took about half of 1 of these Gummies and then 30 minutes later began to have "confusion", anxiety, "jitteriness".  Patient arrives and is shaking in bed upon my initial examination.  Once painful stimuli is applied patient shaking ceases and patient becomes alert.  He is alert and oriented x 4.  He has a neurological examination is reassuring without focal neurodeficits.  He denies SI, HI, AVH.  He denies any alcohol use tonight.   Ingestion       Home Medications Prior to Admission medications   Medication Sig Start Date End Date Taking? Authorizing Provider  ARIPiprazole (ABILIFY) 5 MG tablet Take 1 tablet (5 mg total) by mouth daily. Patient not taking: Reported on 01/20/2023 01/12/23   Shanna Cisco, NP  escitalopram (LEXAPRO) 20 MG tablet Take 1 tablet (20 mg total) by mouth daily. 01/12/23   Shanna Cisco, NP  hydrOXYzine (ATARAX) 25 MG tablet Take 0.5 tablets (12.5 mg total) by mouth 3 (three) times daily as needed. 01/12/23   Shanna Cisco, NP  loratadine (CLARITIN) 10 MG tablet Take 1 tablet (10 mg total) by mouth daily. 01/20/23   Grayce Sessions, NP   Multiple Vitamins-Iron (DAILY MULTIVITAMINS/IRON PO) Take 1 tablet by mouth daily.    [provider]      Allergies    Patient has no known allergies.    Review of Systems   Review of Systems  Psychiatric/Behavioral:  Positive for behavioral problems.   All other systems reviewed and are negative.   Physical Exam Updated Vital Signs BP (!) 101/51   Pulse 94   Temp 98.8 F (37.1 C) (Oral)   Resp 20   Ht 5\' 11"  (1.803 m)   Wt 116 kg   SpO2 97%   BMI 35.67 kg/m  Physical Exam Vitals and nursing note reviewed.  Constitutional:      General: He is not in acute distress.    Appearance: Normal appearance. He is not ill-appearing, toxic-appearing or diaphoretic.  HENT:     Head: Normocephalic and atraumatic.     Nose: Nose normal.     Mouth/Throat:     Mouth: Mucous membranes are moist.     Pharynx: Oropharynx is clear.  Eyes:     Extraocular Movements: Extraocular movements intact.     Conjunctiva/sclera: Conjunctivae normal.     Pupils: Pupils are equal, round, and reactive to light.  Cardiovascular:     Rate and Rhythm: Normal rate and regular rhythm.  Pulmonary:     Effort: Pulmonary effort is normal.     Breath sounds: Normal  breath sounds. No wheezing.  Abdominal:     General: Abdomen is flat. Bowel sounds are normal.     Palpations: Abdomen is soft.     Tenderness: There is no abdominal tenderness.  Musculoskeletal:     Cervical back: Normal range of motion and neck supple. No tenderness.  Skin:    General: Skin is warm and dry.     Capillary Refill: Capillary refill takes less than 2 seconds.  Neurological:     General: No focal deficit present.     Mental Status: He is alert and oriented to person, place, and time.     GCS: GCS eye subscore is 4. GCS verbal subscore is 5. GCS motor subscore is 6.     Cranial Nerves: Cranial nerves 2-12 are intact. No cranial nerve deficit.     Sensory: Sensation is intact. No sensory deficit.     Motor: Motor  function is intact. No weakness.     Coordination: Coordination is intact. Heel to [facility] Test normal.     Comments: CN II through XII intact.  Patient alert and oriented x 4.  Intact finger-nose and heel-to-shin.  5 out of 5 strength bilateral upper extremities.  5 out of 5 strength bilateral lower extremities.  No pronator drift, no slurred speech, no facial droop.     ED Results / Procedures / Treatments   Labs (all labs ordered are listed, but only abnormal results are displayed) Labs Reviewed  CBC - Abnormal; Notable for the following components:      Result Value   WBC 13.0 (*)    All other components within normal limits  COMPREHENSIVE METABOLIC PANEL - Abnormal; Notable for the following components:   Potassium 3.4 (*)    CO2 21 (*)    Glucose, Bld 126 (*)    Calcium 8.8 (*)    Total Protein 6.4 (*)    All other components within normal limits  URINALYSIS, ROUTINE W REFLEX MICROSCOPIC - Abnormal; Notable for the following components:   Color, Urine STRAW (*)    Specific Gravity, Urine 1.001 (*)    All other components within normal limits  RAPID URINE DRUG SCREEN, HOSP PERFORMED - Abnormal; Notable for the following components:   Benzodiazepines POSITIVE (*)    Tetrahydrocannabinol POSITIVE (*)    All other components within normal limits    EKG EKG Interpretation Date/Time:  Sunday April 09 2023 19:46:03 EDT Ventricular Rate:  116 PR Interval:  160 QRS Duration:  87 QT Interval:  315 QTC Calculation: 438 R Axis:   33  Text Interpretation: Sinus tachycardia Baseline wander in lead(s) V1 no acute ST/T changes Confirmed by Pricilla Loveless [phone]) on 04/09/2023 11:04:08 PM  Radiology No results found.  Procedures Procedures   Medications Ordered in ED Medications  sodium chloride 0.9 % bolus 1,000 mL (0 mLs Intravenous Stopped 04/09/23 2155)  ondansetron (ZOFRAN) injection 4 mg (4 mg Intravenous Given 04/09/23 2216)    ED Course/ Medical Decision Making/  A&P  Medical Decision Making Amount and/or Complexity of Data Reviewed Labs: ordered.  Risk Prescription drug management.   22 year old male presents to ED for evaluation.  Please see HPI for further details.  On my initial entrance to the room, the patient is seen to be shaking uncontrollably in bed with nursing staff at the bedside.  The patient appears to be attempting to have a pseudoseizure.  Painful stimuli was applied and the patient broke out of this "pseudoseizure" and was fully alert and  oriented.  On examination the patient is afebrile, tachycardic.  His lung sounds are clear bilaterally and he is not hypoxic.  Abdomen is soft and compressible throughout.  Neurological examination during assessment is without focal neurodeficits.  Patient CBC shows a slight leukocytosis of 13 which I favor to be a stress response.  There is no anemia.  His metabolic panel shows a potassium of 3.4, sodium 137.  His creatinine is 1.06, he has no anion gap elevation.  His rapid urine drug screen is positive for THC and benzodiazepines.  His urinalysis shows no obvious source of infection.  CT scan of the patient head is unremarkable.  Patient EKG is also nonischemic.  Patient given 1 L fluid for tachycardia.  On reassessment, the patient pulse rate has normalized.  Patient given 4 mg Zofran for nausea.  During patient workup, nursing staff notified me that the patient had began to shake in bed once more.  Nursing staff notified me that they had applied painful stimuli once more and the patient immediately broke out at this shaking fit he was having.  The patient was immediately alert and oriented, had no postictal period.  Unsure of etiology of these episodes however the patient CT scan of his head unremarkable.  I doubt seizures as the patient has no postictal period, he immediately breaks out of the spells and is fully alert and oriented.    At this time, the patient workup is unremarkable.  The  patient workup is reassuring.  The patient will be discharged home at this time.  He will follow-up with his PCP for reevaluation.  I advised the patient that if he has any new or worsening symptoms to return to the ED for further management and care.  He voiced understanding with my instructions.  The patient's mother is here to pick him up from the ER.  He is discharged in stable condition.  Final Clinical Impression(s) / ED Diagnoses Final diagnoses:  Ingestion of substance, undetermined intent, initial encounter    Rx / DC Orders ED Discharge Orders     None           Al Decant, PA-C 04/09/23 2316    Pricilla Loveless, MD 04/10/23 1627

## 2023-05-10 ENCOUNTER — Other Ambulatory Visit: Payer: Self-pay

## 2023-05-16 ENCOUNTER — Other Ambulatory Visit: Payer: Self-pay

## 2023-05-17 ENCOUNTER — Telehealth (HOSPITAL_COMMUNITY): Payer: Self-pay | Admitting: Mental Health

## 2023-05-17 ENCOUNTER — Encounter (HOSPITAL_COMMUNITY): Payer: Self-pay

## 2023-05-17 ENCOUNTER — Ambulatory Visit (HOSPITAL_COMMUNITY): Payer: BLUE CROSS/BLUE SHIELD | Admitting: Mental Health

## 2023-05-17 NOTE — Telephone Encounter (Signed)
Therapist sent link for tele-therapy appointment x 2. No response after x 10 minutes. Contacted pt via telephone, no answer and mailbox is full. NS

## 2023-05-26 ENCOUNTER — Encounter (HOSPITAL_COMMUNITY): Payer: Self-pay

## 2023-05-26 ENCOUNTER — Ambulatory Visit (HOSPITAL_COMMUNITY)
Admission: EM | Admit: 2023-05-26 | Discharge: 2023-05-26 | Disposition: A | Discharge status: Home / Self Care | Payer: BLUE CROSS/BLUE SHIELD | Attending: Internal Medicine | Admitting: Internal Medicine

## 2023-05-26 DIAGNOSIS — B9789 Other viral agents as the cause of diseases classified elsewhere: Secondary | ICD-10-CM | POA: Insufficient documentation

## 2023-05-26 DIAGNOSIS — J029 Acute pharyngitis, unspecified: Secondary | ICD-10-CM | POA: Insufficient documentation

## 2023-05-26 DIAGNOSIS — U071 COVID-19: Secondary | ICD-10-CM | POA: Diagnosis not present

## 2023-05-26 LAB — POCT RAPID STREP A (OFFICE): Rapid Strep A Screen: NEGATIVE

## 2023-05-26 NOTE — ED Provider Notes (Signed)
[facility]    CSN: [id number] Arrival date & time: 05/26/23  1715      History   Chief Complaint Chief Complaint  Patient presents with   Sore Throat    HPI Riley Smith is a 22 y.o. male.   Riley Smith is a 22 y.o. male presenting for chief complaint of sore throat that started today.  Sore throat is currently a 4 on a scale of 0-10 and worsened by swallowing.  Reports mild nonproductive cough and generalized headache as well.  No nasal congestion, rhinorrhea, dizziness, vision changes, chest pain, shortness of breath, nausea, vomiting, abdominal discomfort, or rash.  His sister is sick with strep throat currently and he has been around her causing concern for strep infection.  Denies voice changes/hoarseness, difficulty maintaining secretions, drooling, trismus, and throat closure sensation. Has not attempted use of any OTC medications PTA for symptoms.    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 Authorizing Provider  ARIPiprazole (ABILIFY) 5 MG tablet Take 1 tablet (5 mg total) by mouth daily. Patient not taking: Reported on 01/20/2023 01/12/23   Shanna Cisco, NP  escitalopram (LEXAPRO) 20 MG tablet Take 1 tablet (20 mg total) by mouth daily.  01/12/23   Shanna Cisco, NP  hydrOXYzine (ATARAX) 25 MG tablet Take 0.5 tablets (12.5 mg total) by mouth 3 (three) times daily as needed. 01/12/23   Shanna Cisco, NP  loratadine (CLARITIN) 10 MG tablet Take 1 tablet (10 mg total) by mouth daily. 01/20/23   Grayce Sessions, NP  Multiple Vitamins-Iron (DAILY MULTIVITAMINS/IRON PO) Take 1 tablet by mouth daily.    [provider]    Family History Family History  Problem Relation Age of Onset   Healthy Mother    Depression Mother    Healthy Father     Social History Social History   Tobacco Use   Smoking status: Never   Smokeless tobacco: Never  Vaping Use   Vaping status: Never Used  Substance Use Topics   Alcohol use: Yes    Comment: occ   Drug use: Never     Allergies   Patient has no known allergies.   Review of Systems Review of Systems Per HPI  Physical Exam Triage Vital Signs ED Triage Vitals [05/26/23 1826]  Encounter Vitals Group     BP 99/64     Systolic BP Percentile      Diastolic BP Percentile      Pulse Rate 75     Resp 18     Temp 98.4 F (36.9 C)     Temp Source Oral     SpO2 98 %     Weight  Height      Head Circumference      Peak Flow      Pain Score 4     Pain Loc      Pain Education      Exclude from Growth Chart    No data found.  Updated Vital Signs BP 99/64 (BP Location: Left Arm)   Pulse 75   Temp 98.4 F (36.9 C) (Oral)   Resp 18   SpO2 98%   Visual Acuity Right Eye Distance:   Left Eye Distance:   Bilateral Distance:    Right Eye Near:   Left Eye Near:    Bilateral Near:     Physical Exam Vitals and nursing note reviewed.  Constitutional:      Appearance: He is not ill-appearing or toxic-appearing.  HENT:     Head: Normocephalic and atraumatic.     Right Ear: Hearing, tympanic membrane, ear canal and external ear normal.     Left Ear: Hearing, tympanic membrane, ear canal and external ear normal.     Nose: Nose normal.      Mouth/Throat:     Lips: Pink.     Mouth: Mucous membranes are moist. No injury.     Tongue: No lesions. Tongue does not deviate from midline.     Palate: No mass and lesions.     Pharynx: Oropharynx is clear. Uvula midline. Posterior oropharyngeal erythema present. No pharyngeal swelling, oropharyngeal exudate or uvula swelling.     Tonsils: No tonsillar exudate or tonsillar abscesses.     Comments: No trismus, phonation normal, maintaining secretions without difficulty.   Eyes:     General: Lids are normal. Vision grossly intact. Gaze aligned appropriately.     Extraocular Movements: Extraocular movements intact.     Conjunctiva/sclera: Conjunctivae normal.  Cardiovascular:     Rate and Rhythm: Normal rate and regular rhythm.     Heart sounds: Normal heart sounds, S1 normal and S2 normal.  Pulmonary:     Effort: Pulmonary effort is normal. No respiratory distress.     Breath sounds: Normal breath sounds and air entry.  Musculoskeletal:     Cervical back: Normal range of motion and neck supple.  Lymphadenopathy:     Cervical: Cervical adenopathy present.  Skin:    General: Skin is warm and dry.     Capillary Refill: Capillary refill takes less than 2 seconds.     Findings: No rash.  Neurological:     General: No focal deficit present.     Mental Status: He is alert and oriented to person, place, and time. Mental status is at baseline.     Cranial Nerves: No dysarthria or facial asymmetry.  Psychiatric:        Mood and Affect: Mood normal.        Speech: Speech normal.        Behavior: Behavior normal.        Thought Content: Thought content normal.        Judgment: Judgment normal.      UC Treatments / Results  Labs (all labs ordered are listed, but only abnormal results are displayed) Labs Reviewed  SARS CORONAVIRUS 2 (TAT 6-24 HRS)  CULTURE, GROUP A STREP [facility])  POCT RAPID STREP A (OFFICE)    EKG   Radiology No results found.  Procedures Procedures  (including critical care time)  Medications Ordered in UC Medications - No data to display  Initial Impression / Assessment and Plan / UC Course  I have reviewed the triage vital signs and the nursing notes.  Pertinent labs & imaging results that were available during my care of the patient were reviewed by me and considered in my medical decision making (see chart for details).   1. Viral pharyngitis Evaluation suggests viral pharyngitis etiology.  Group A strep POC testing negative, throat culture pending, will treat based on throat culture results for bacterial pharyngitis if indicated. Low suspicion for mononucleosis, epiglottitis, peritonsillar abscess, etc.  HEENT exam stable without red flag signs. Will manage this conservatively with supportive care. Tylenol/ibuprofen as needed for pain and inflammation. Salt water gargles as needed, warm water with honey.   Counseled patient on potential for adverse effects with medications prescribed/recommended today, strict ER and return-to-clinic precautions discussed, patient verbalized understanding.    Final Clinical Impressions(s) / UC Diagnoses   Final diagnoses:  Viral pharyngitis     Discharge Instructions      Strep test in the clinic is negative, staff will call you if the throat culture is positive for bacteria in the next 2-3 days and call in treatment if necessary based on result.   For now, we will treat this as a viral infection with the following: - Over the counter medicines as needed for pain and swelling of the throat (Ibuprofen 600mg  and/or Tylenol 1,000mg  every 6 hours as needed) - 1 tablespoon of honey in warm water and/or salt water gargles every 3-4 hours  Seek medical care if you develop changes to your voice, inability to swallow, drooling, or any new symptoms. If symptoms are severe, please go to the ER. I hope you feel better!     ED Prescriptions   None    PDMP not reviewed this encounter.    Carlisle Beers, [place] 05/26/23 1916

## 2023-05-26 NOTE — ED Triage Notes (Signed)
Pt c/o soreness and tightness to throat all day. States his sister tested positive for strep today. Denies taking any meds for sx's.

## 2023-05-26 NOTE — Discharge Instructions (Signed)
Strep test in the clinic is negative, staff will call you if the throat culture is positive for bacteria in the next 2-3 days and call in treatment if necessary based on result.   For now, we will treat this as a viral infection with the following: - Over the counter medicines as needed for pain and swelling of the throat (Ibuprofen 600mg  and/or Tylenol 1,000mg  every 6 hours as needed) - 1 tablespoon of honey in warm water and/or salt water gargles every 3-4 hours  Seek medical care if you develop changes to your voice, inability to swallow, drooling, or any new symptoms. If symptoms are severe, please go to the ER. I hope you feel better!

## 2023-05-27 LAB — SARS CORONAVIRUS 2 (TAT 6-24 HRS): SARS Coronavirus 2: POSITIVE — AB

## 2023-05-29 LAB — CULTURE, GROUP A STREP (THRC)

## 2023-06-22 ENCOUNTER — Other Ambulatory Visit: Payer: Self-pay

## 2023-06-23 ENCOUNTER — Other Ambulatory Visit: Payer: Self-pay

## 2023-06-29 ENCOUNTER — Other Ambulatory Visit: Payer: Self-pay

## 2023-07-03 ENCOUNTER — Telehealth (INDEPENDENT_AMBULATORY_CARE_PROVIDER_SITE_OTHER): Payer: BLUE CROSS/BLUE SHIELD | Admitting: Licensed Clinical Social Worker

## 2023-07-03 ENCOUNTER — Ambulatory Visit (INDEPENDENT_AMBULATORY_CARE_PROVIDER_SITE_OTHER): Payer: BLUE CROSS/BLUE SHIELD | Admitting: Licensed Clinical Social Worker

## 2023-07-03 DIAGNOSIS — Z91199 Patient's noncompliance with other medical treatment and regimen due to unspecified reason: Secondary | ICD-10-CM

## 2023-07-03 NOTE — Telephone Encounter (Signed)
[facility] LCSW Note  07/03/2023   1:27 PM  Type of Contact:  No-Show/Phone Contact   Clinician contacted pt to offer opportunity to reschedule missed apt from 1300 today. Pt reported complications with transportation app on phone. Clinician inquired as to whether pt would like to reschedule intake apt. Pt declined sharing of tentative arrangements with family and potential for pt to traveling to [place] in the coming weeks.    Leisa Lenz, LCSW 07/03/2023  1:27 PM

## 2023-07-03 NOTE — Progress Notes (Signed)
Patient ID: Riley Smith, male   DOB: Jan 19, 2001, 22 y.o.   MRN: [id number]   [facility] LCSW Note   07/03/2023   1:27 PM   Type of Contact:  No-Show/Phone Contact    Clinician contacted pt to offer opportunity to reschedule missed apt from 1300 today. Pt reported complications with transportation app on phone. Clinician inquired as to whether pt would like to reschedule intake apt. Pt declined sharing of tentative arrangements with family and potential for pt to traveling to [place] in the coming weeks.  Cyril Loosen, LCSW 07/03/23 1659

## 2023-09-29 ENCOUNTER — Ambulatory Visit (INDEPENDENT_AMBULATORY_CARE_PROVIDER_SITE_OTHER): Payer: MEDICAID | Admitting: Licensed Clinical Social Worker

## 2023-09-29 ENCOUNTER — Other Ambulatory Visit (HOSPITAL_COMMUNITY): Payer: Self-pay | Admitting: Psychiatry

## 2023-09-29 DIAGNOSIS — F411 Generalized anxiety disorder: Secondary | ICD-10-CM | POA: Diagnosis not present

## 2023-09-29 DIAGNOSIS — F331 Major depressive disorder, recurrent, moderate: Secondary | ICD-10-CM

## 2023-09-29 DIAGNOSIS — F609 Personality disorder, unspecified: Secondary | ICD-10-CM | POA: Diagnosis not present
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
## 2023-10-02 ENCOUNTER — Other Ambulatory Visit: Payer: Self-pay

## 2023-10-05 ENCOUNTER — Encounter (HOSPITAL_COMMUNITY): Payer: Self-pay

## 2023-10-05 ENCOUNTER — Ambulatory Visit (INDEPENDENT_AMBULATORY_CARE_PROVIDER_SITE_OTHER): Payer: MEDICAID | Admitting: Licensed Clinical Social Worker

## 2023-10-05 DIAGNOSIS — F411 Generalized anxiety disorder: Secondary | ICD-10-CM | POA: Diagnosis not present

## 2023-10-05 DIAGNOSIS — F331 Major depressive disorder, recurrent, moderate: Secondary | ICD-10-CM

## 2023-10-05 DIAGNOSIS — F609 Personality disorder, unspecified: Secondary | ICD-10-CM

## 2023-10-05 NOTE — Progress Notes (Unsigned)
THERAPIST PROGRESS NOTE   Session Date: 10/05/2023  Session Time: 1503 - 1552 Virtual Visit via Video Note  I connected with Riley Smith on 10/05/23 at  3:03 PM EST by a video enabled telemedicine application and verified that I am speaking with the correct person using two identifiers.  Location: Patient: Home Provider: Home Office   I discussed the limitations of evaluation and management by telemedicine and the availability of in person appointments. The patient expressed understanding and agreed to proceed.  I discussed the assessment and treatment plan with the patient. The patient was provided an opportunity to ask questions and all were answered. The patient agreed with the plan and demonstrated an understanding of the instructions.   The patient was advised to call back or seek an in-person evaluation if the symptoms worsen or if the condition fails to improve as anticipated.  I provided 49 minutes of non-face-to-face time during this encounter.  Participation Level: Active  Behavioral Response: CasualAlertEuthymic  Type of Therapy: Individual Therapy  Treatment Goals addressed:  - LTG: Reduce frequency, intensity, and duration of depression symptoms so that daily functioning is improved (OP Depression) - LTG: Increase coping skills to manage depression and improve ability to perform daily activities (OP Depression) - STG: Riley Smith will identify cognitive patterns and beliefs that support depression (OP Depression) - LTG: "I want to see improvements in my moods AEB doing more things I enjoy and feeling joy out of life" (OP Depression) - STG: Report a decrease in anxiety symptoms as evidenced by an overall reduction in anxiety score by a minimum of 25% on the Generalized Anxiety Disorder Scale (GAD-7) (Anxiety) - LTG: "I'd like to worry less about just everything" (Anxiety)  ProgressTowards Goals: Initial  Interventions: CBT, Solution Focused, and Supportive  Summary:  Riley Smith is a 23 y.o. male with past psych history of Moderate episode of recurrent major depressive disorder (HCC), GAD and Personality D/O in Adult, presenting for follow-up therapy session in efforts to improve management of depressive and anxious symptoms. Patient actively engaged in session, presenting in pleasant moods and congruent affect throughout, initially experiencing technical difficulties with video connection and disrupted audio connection, however maintained connection throughout.  Patient actively engaged in providing recounts of past weekend and recent week to date, sharing of varying stressors surrounding work and responsibilities regarding tasks necessary with it now being tax season.  Revisited previous intake session with patient, exploring patient's thoughts and feelings surrounding pursuing treatment, engaging in further discussion and efforts of developing treatment plan.  Patient actively identified applicable goals focusing on both depression and anxiety, as well as identifying additional standardized measurable goals.  Patient responded well to interventions. Patient continues to meet criteria for Moderate episode of recurrent major depressive disorder (HCC), GAD and Personality D/O in Adult. Patient will continue to benefit from engagement in outpatient therapy due to being the least restrictive service to meet presenting needs.      09/29/2023    8:27 AM 03/31/2023   10:37 AM 01/20/2023    9:44 AM 01/12/2023   12:38 PM 12/16/2022    8:14 AM  Depression screen PHQ 2/9  Decreased Interest 2 2 2 2 2   Down, Depressed, Hopeless 2 2 3 3 3   PHQ - 2 Score 4 4 5 5 5   Altered sleeping 2 1 2 3 3   Tired, decreased energy 2 2 2 2 3   Change in appetite 1 1 1  0 3  Feeling bad or failure about yourself  2  2 2 3 3   Trouble concentrating 2 1 2 2 2   Moving slowly or fidgety/restless 0 0 1 1 2   Suicidal thoughts 1 0 1 1 1   PHQ-9 Score 14 11 16 17 22   Difficult doing work/chores Somewhat  difficult Somewhat difficult  Somewhat difficult Very difficult   Flowsheet Row Counselor from 09/29/2023 in [facility] at [facility] ED from 05/26/2023 in [facility] at [facility] ED from 04/09/2023 in [facility] Emergency Department at [facility]  C-SSRS RISK CATEGORY Moderate Risk No Risk No Risk         09/29/2023    8:26 AM 03/31/2023   10:39 AM 01/20/2023    9:44 AM 01/12/2023   12:38 PM  GAD 7 : Generalized Anxiety Score  Nervous, Anxious, on Edge 2 1 3 2   Control/stop worrying 2 1 2 2   Worry too much - different things 2 2 2 2   Trouble relaxing 1 1 1 1   Restless 1 1 2 1   Easily annoyed or irritable 1 2 1 2   Afraid - awful might happen 2 1 3 3   Total GAD 7 Score 11 9 14 13   Anxiety Difficulty Somewhat difficult Somewhat difficult  Somewhat difficult     Suicidal/Homicidal: Nowithout intent/plan  Therapist Response: Clinician utilized CBT, MI, and solution focused interventions to address presenting problems and/or focus of treatment.  Actively engage patient and review of previous session, exploring any questions patient may have in relation to intake session.  Inquired as to whether patient had scheduled a follow-up visit with med man provider, encouraging patient to attend newly scheduled appointment with provider in this office.  Evoked patient's recounts of past weekend and week thus far, eliciting detailed events, exploring thoughts, perspectives, stressors, and challenges.  Further engaged patient in identifying individualized treatment specific goals focused on depression and anxiety in and of developing treatment plan.   Clinician reassessed severity of presenting sxs, and presence of any safety concerns. Therapist provided support and empathy to patient during session.  Plan: Return again in 2-3 weeks.  Diagnosis:  Encounter Diagnoses  Name Primary?   Moderate episode of recurrent major depressive disorder (HCC) Yes    Generalized anxiety disorder    Personality disorder in adult [facility])     Collaboration of Care: Other none necessary at this time.  Patient/Guardian was advised Release of Information must be obtained prior to any record release in order to collaborate their care with an outside provider. Patient/Guardian was advised if they have not already done so to contact the registration department to sign all necessary forms in order for [place] to release information regarding their care.   Consent: Patient/Guardian gives verbal consent for treatment and assignment of benefits for services provided during this visit. Patient/Guardian expressed understanding and agreed to proceed.   Leisa Lenz, MSW, LCSW 10/05/2023,  3:09 PM

## 2023-10-15 NOTE — Progress Notes (Unsigned)
Psychiatric Follow Up Adult Assessment  Date: 10/16/2023, 2:30 PM  Patient Identification: Riley Smith "Breck" MRN: [id number] DOB: 11-May-2001    ASSESSMENT / PLAN  Riley Smith is a 23 y.o. male with PMH of MDD, GAD, and unspecified personality disorder who presents for medication management. Patient is a transfer from Isac Sarna at [facility] due to obtaining insurance and was last seen by her 01/12/23. Patient had been hospitalized 11/18/22 for SI due to psychosocial stressors and once in 06/2020. Patient was last prescribed lexapro 20 qd, abilify 5 qd, and hydroxyzine prn in 06/2023 and likely ran out in 07/2023 if taking as prescribed. He has been seeing counselor [name] and then by Cyril Loosen.   Today, he reports that he had run out of lexapro and abilify around early to middle of December 2024.  He noticed a return of his depression and anxiety after running out of medication.  He reports it was effectively controlled when he was on Lexapro and Abilify with as needed hydroxyzine.  He reported recent worsening anxiety that initially he was worried was due to taking hydroxyzine 25 mg rather than half tablet; however, this is unlikely given previous tolerance and he did note having psychosocial stressors that may have contributed as well. Lexapro monotherapy had been insufficient to manage MDD, so restarting Lexapro and Abilify at half of original dose and reevaluate for further titration next month. Patient will need updated metabolic labs which we will discuss next visit.  Moderate episode of recurrent major depressive disorder (HCC) -     ARIPiprazole; Take 0.5 tablets (2.5 mg total) by mouth daily.  Dispense: 30 tablet; Refill: 0 -     Escitalopram Oxalate; Take 1 tablet (10 mg total) by mouth daily.  Dispense: 30 tablet; Refill: 0 -     hydrOXYzine HCl; Take 1 tablet (10 mg total) by mouth 3 (three) times daily as needed.  Dispense: 90 tablet; Refill: 0  Personality disorder in adult  [facility]) -     ARIPiprazole; Take 0.5 tablets (2.5 mg total) by mouth daily.  Dispense: 30 tablet; Refill: 0  Generalized anxiety disorder -     Escitalopram Oxalate; Take 1 tablet (10 mg total) by mouth daily.  Dispense: 30 tablet; Refill: 0 -     hydrOXYzine HCl; Take 1 tablet (10 mg total) by mouth 3 (three) times daily as needed.  Dispense: 90 tablet; Refill: 0     Follow-up on: Visit date not found  Future Appointments  Date Time Provider Department Center  10/24/2023  1:00 PM Leisa Lenz, LCSW [facility] None  10/31/2023  1:00 PM Leisa Lenz, LCSW [facility] None  11/08/2023 11:00 AM Leisa Lenz, LCSW [facility] None  11/14/2023  4:00 PM Leisa Lenz, LCSW [facility] None  11/21/2023  1:00 PM Leisa Lenz, LCSW [facility] None  11/27/2023  2:00 PM Leisa Lenz, LCSW [facility] None  12/04/2023  9:00 AM Leisa Lenz, LCSW [facility] None     Patient was given contact information for behavioral health clinic and was instructed to call 911 for emergencies.   Interval History  Chief Complaint: Medication Management  Patient presents virtually for follow-up.  He had previously been seeing [place] at [facility] but obtained insurance through his job at [employer] so was moved to the [place] clinic.  He reports running out of Lexapro and Abilify approximately early December 2024 and he notices significant worsening of depression and anxiety.  He reports attempting to  use hydroxyzine to manage anxiety but has found that was ineffective and may have worsened anxiety.  He does endorse situational anxiety that may be contributing to his overall perception of anxiety.  He denies HI/AVH.  He does endorse passive SI.  He denies significant appetite or sleep problems. He denies any substance use with the exception of alcohol to which he drinks sporadically 1-2 shots every other week.  Current outpatient therapist: Cyril Loosen LCSW    PAST HISTORY   Past Psychiatric History:  Hospitalizations:  twice for SI Suicide attempts: denies Psychotherapy: endorses  Past Medical History: None  Allergies: Patient has no known allergies.    Social History:  Living with: father Income: Retail buyer  Substance Use History: EtOH:  reports current alcohol use. Sporadic 1-2 shots every other week Nicotine:  reports that he has never smoked. He has never used smokeless tobacco.  Substance Abuse History in the last 12 months:  No.    Past Medical History:  Past Medical History:  Diagnosis Date   Anxiety    Depression    Suicidal behavior     Past Surgical History:  Procedure Laterality Date   No prior surgery      Family History:  Family History  Problem Relation Age of Onset   Healthy Mother    Depression Mother    Healthy Father     Social History:   Social History   Socioeconomic History   Marital status: Single    Spouse name: Not on file   Number of children: 0   Years of education: Not on file   Highest education level: Some college, no degree  Occupational History   Not on file  Tobacco Use   Smoking status: Never   Smokeless tobacco: Never  Vaping Use   Vaping status: Never Used  Substance and Sexual Activity   Alcohol use: Yes    Comment: occ   Drug use: Never   Sexual activity: Not Currently  Other Topics Concern   Not on file  Social History Narrative   Not on file   Social Drivers of Health   Financial Resource Strain: Low Risk  (01/20/2023)   Overall Financial Resource Strain (CARDIA)    Difficulty of Paying Living Expenses: Not very hard  Recent Concern: Financial Resource Strain - Medium Risk (12/16/2022)   Overall Financial Resource Strain (CARDIA)    Difficulty of Paying Living Expenses: Somewhat hard  Food Insecurity: Food Insecurity Present (01/20/2023)   Hunger Vital Sign    Worried About Running Out of Food in the Last Year: Sometimes true    Ran Out of Food in the Last Year: Never true  Transportation Needs: Unmet  Transportation Needs (01/20/2023)   PRAPARE - Transportation    Lack of Transportation (Medical): Yes    Lack of Transportation (Non-Medical): Yes  Physical Activity: Insufficiently Active (01/20/2023)   Exercise Vital Sign    Days of Exercise per Week: 1 day    Minutes of Exercise per Session: 20 min  Stress: Stress Concern Present (01/20/2023)   [employer] of Occupational Health - Occupational Stress Questionnaire    Feeling of Stress : Very much  Social Connections: Socially Isolated (01/20/2023)   Social Connection and Isolation Panel [NHANES]    Frequency of Communication with Friends and Family: Once a week    Frequency of Social Gatherings with Friends and Family: Never    Attends Religious Services: Never    Database administrator or Organizations:  No    Attends Banker Meetings: Not on file    Marital Status: Never married    Allergies: No Known Allergies  Current Medications: Current Outpatient Medications  Medication Sig Dispense Refill   ARIPiprazole (ABILIFY) 5 MG tablet Take 0.5 tablets (2.5 mg total) by mouth daily. 30 tablet 0   escitalopram (LEXAPRO) 10 MG tablet Take 1 tablet (10 mg total) by mouth daily. 30 tablet 0   hydrOXYzine (ATARAX) 10 MG tablet Take 1 tablet (10 mg total) by mouth 3 (three) times daily as needed. 90 tablet 0   loratadine (CLARITIN) 10 MG tablet Take 1 tablet (10 mg total) by mouth daily. 30 tablet 11   Multiple Vitamins-Iron (DAILY MULTIVITAMINS/IRON PO) Take 1 tablet by mouth daily.     No current facility-administered medications for this visit.        OBJECTIVE  There were no vitals taken for this visit.  Psychiatric Specialty Exam: General Appearance: Casual, faily groomed, not guarded   Eye Contact:  Good  Speech:  Clear, coherent, normal rate, non-pressured  Volume:  Normal  Mood:  "anxious"  Affect:  Appropriate, congruent, full range   Thought Content: Logical, no rumination. No command or non-command  AVH, paranoid delusions, first rank sxs.   Suicidal Thoughts:  Denied active and passive SI  Homicidal Thoughts:  Denied active and passive HI  Thought Process:  Coherent, goal-directed, mostly linear, circumstantial at times   Orientation:  A&Ox4  Memory:  Immediate good  Judgement:  Fair  Insight:  Fair, shallow  Concentration:  Attention and concentration good   Recall:  [employer] of Knowledge:  Fair  Language:  Good, no aphasia  Psychomotor Activity:  Normal  Akathisia:  denies  AIMS (if indicated):  NA    Assets:  Communication Skills Desire for Improvement Housing Leisure Time Physical Health Resilience  ADL's:  Intact  Cognition:  WNL  Sleep:  fair     Wt Readings from Last 3 Encounters:  04/09/23 255 lb 11.7 oz (116 kg)  02/12/23 253 lb 15.5 oz (115.2 kg)  01/20/23 254 lb (115.2 kg)   Temp Readings from Last 3 Encounters:  05/26/23 98.4 F (36.9 C) (Oral)  04/09/23 98.8 F (37.1 C) (Oral)  02/12/23 98.2 F (36.8 C)   BP Readings from Last 3 Encounters:  05/26/23 99/64  04/09/23 (!) 101/51  02/12/23 115/72   Pulse Readings from Last 3 Encounters:  05/26/23 75  04/09/23 94  02/12/23 (!) 103     Physical Exam  Strength & Muscle Tone: within normal limits Gait & Station: normal  Screenings:  AIMS    Flowsheet Row [facility]  AIMS Total Score 0      AUDIT    Flowsheet Row [facility]  Alcohol Use Disorder Identification Test Final Score (AUDIT) 2      GAD-7    Flowsheet Row Counselor from 09/29/2023 in [facility] [place] Counselor from 03/31/2023 in [facility] Office Visit from 01/20/2023 in [facility] Clinical Support from 01/12/2023 in [facility] Counselor from 12/16/2022 in  [facility]  Total GAD-7 Score 11 9 14 13 18       PHQ2-9    Flowsheet Row Counselor from 09/29/2023 in [facility] from 03/31/2023 in  [facility] Office Visit from 01/20/2023 in [facility] Clinical Support from 01/12/2023 in [facility] Counselor from 12/16/2022 in [facility]  PHQ-2 Total Score 4 4 5 5 5   PHQ-9 Total Score 14 11 16 17 22       Flowsheet Row Counselor from 09/29/2023 in [facility] at [name] ED from 05/26/2023 in [facility] at [facility] ED from 04/09/2023 in [facility] Emergency Department at [facility]  C-SSRS RISK CATEGORY Moderate Risk No Risk No Risk       Collaboration of Care: Case discussed with current outpatient attending, see attending's attestation for additional information  Televisit via video: I connected with [name] on 10/16/23 at  2:00 PM EST by a video enabled telemedicine application and verified that I am speaking with the correct person using two identifiers.  Location: Patient: home Provider: office   I discussed the limitations of evaluation and management by telemedicine and the availability of in person appointments. The patient expressed understanding and agreed to proceed.  I discussed the assessment and treatment plan with the patient. The patient was provided an opportunity to ask questions and all were answered. The patient agreed with the plan and demonstrated an understanding of the instructions.   The patient was advised to call back or seek an in-person evaluation if the symptoms worsen or if the condition fails to improve as anticipated.   Signed: Park Pope, MD

## 2023-10-16 ENCOUNTER — Other Ambulatory Visit: Payer: Self-pay

## 2023-10-16 ENCOUNTER — Telehealth (HOSPITAL_COMMUNITY): Payer: MEDICAID | Admitting: Student

## 2023-10-16 DIAGNOSIS — F331 Major depressive disorder, recurrent, moderate: Secondary | ICD-10-CM | POA: Diagnosis not present

## 2023-10-16 DIAGNOSIS — F609 Personality disorder, unspecified: Secondary | ICD-10-CM | POA: Diagnosis not present

## 2023-10-16 DIAGNOSIS — F411 Generalized anxiety disorder: Secondary | ICD-10-CM

## 2023-10-16 MED ORDER — HYDROXYZINE HCL 10 MG PO TABS
10.0000 mg | ORAL_TABLET | Freq: Three times a day (TID) | ORAL | 0 refills | Status: DC | PRN
Start: 2023-10-16 — End: 2023-11-15
  Filled 2023-10-16: qty 90, 30d supply, fill #0

## 2023-10-16 MED ORDER — ARIPIPRAZOLE 5 MG PO TABS
2.5000 mg | ORAL_TABLET | Freq: Every day | ORAL | 0 refills | Status: DC
Start: 2023-10-16 — End: 2023-11-15
  Filled 2023-10-16: qty 30, 60d supply, fill #0

## 2023-10-16 MED ORDER — ESCITALOPRAM OXALATE 10 MG PO TABS
10.0000 mg | ORAL_TABLET | Freq: Every day | ORAL | 0 refills | Status: DC
Start: 2023-10-16 — End: 2023-11-15
  Filled 2023-10-16: qty 30, 30d supply, fill #0

## 2023-10-17 ENCOUNTER — Other Ambulatory Visit: Payer: Self-pay

## 2023-10-24 ENCOUNTER — Ambulatory Visit (INDEPENDENT_AMBULATORY_CARE_PROVIDER_SITE_OTHER): Payer: MEDICAID | Admitting: Licensed Clinical Social Worker

## 2023-10-24 DIAGNOSIS — F609 Personality disorder, unspecified: Secondary | ICD-10-CM

## 2023-10-24 DIAGNOSIS — F411 Generalized anxiety disorder: Secondary | ICD-10-CM

## 2023-10-24 DIAGNOSIS — F331 Major depressive disorder, recurrent, moderate: Secondary | ICD-10-CM | POA: Diagnosis not present

## 2023-10-24 NOTE — Progress Notes (Signed)
THERAPIST PROGRESS NOTE   Session Date: 10/24/2023  Session Time: 1309 - 1357 Virtual Visit via Video Note  I connected with [name] on 10/24/23 at  3:09 PM EST by a video enabled telemedicine application and verified that I am speaking with the correct person using two identifiers.  Location: Patient: Home Provider: Home Office   I discussed the limitations of evaluation and management by telemedicine and the availability of in person appointments. The patient expressed understanding and agreed to proceed.  I discussed the assessment and treatment plan with the patient. The patient was provided an opportunity to ask questions and all were answered. The patient agreed with the plan and demonstrated an understanding of the instructions.   The patient was advised to call back or seek an in-person evaluation if the symptoms worsen or if the condition fails to improve as anticipated.  I provided 48 minutes of non-face-to-face time during this encounter.  Participation Level: Active  Behavioral Response: CasualAlertEuthymic  Type of Therapy: Individual Therapy  Treatment Goals addressed:  - LTG: Reduce frequency, intensity, and duration of depression symptoms so that daily functioning is improved (OP Depression) - LTG: Increase coping skills to manage depression and improve ability to perform daily activities (OP Depression) - STG: K[place]en will identify cognitive patterns and beliefs that support depression (OP Depression) - LTG: "I want to see improvements in my moods AEB doing more things I enjoy and feeling joy out of life" (OP Depression) - STG: Report a decrease in anxiety symptoms as evidenced by an overall reduction in anxiety score by a minimum of 25% on the Generalized Anxiety Disorder Scale (GAD-7) (Anxiety) - LTG: "I'd like to worry less about just everything" (Anxiety)  ProgressTowards Goals: Progressing  Interventions: CBT, Solution Focused, and  Supportive  Summary: Lyle is a 23 y.o. male with past psych history of MDD, GAD, and Personality D/O in Adult, presenting for follow-up in efforts to improve management of depressive and anxious symptoms. Patient actively engaged in session, presenting in pleasant moods and congruent affect throughout, experiencing intermittent technical difficulties with connection reconnecting after 2-3 minutes.  - Re-administered GAD-7, PHQ-9, engaging in processing current scores, noting reduction in frequency and severity of anxious and depressive sxs, in comparison to prior screenings. - Pt detailed of having attended med man appt on 2/3, beginning medication again on 2/4 after approx. 1+ month, reporting improved observations in management of sxs. 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 Processed feelings surrounding events and unsupported by store manager, and receiving greater support from shift manager/lead.  - Explored progressions at primary job, receiving more hours, and having been approached by team of possible interest in assistant management role, processing feelings of improved moods due to positive feedback. - Further processed improvements in moods in relation to discontinuing second job, being praised for performance in primary job, and further improvements due to having received tax refund.  Patient responded well to interventions. Patient continues to meet criteria for MDD, GAD, and Personality D/O in Adult. Patient will continue to benefit from engagement in outpatient therapy due to being the least restrictive service to meet presenting needs.      10/24/2023    1:15 PM 09/29/2023    8:27 AM 03/31/2023   10:37 AM 01/20/2023  9:44 AM 01/12/2023   12:38 PM   Depression screen PHQ 2/9  Decreased Interest 1 2 2 2 2   Down, Depressed, Hopeless 1 2 2 3 3   PHQ - 2 Score 2 4 4 5 5   Altered sleeping 0 2 1 2 3   Tired, decreased energy 1 2 2 2 2   Change in appetite 1 1 1 1  0  Feeling bad or failure about yourself  1 2 2 2 3   Trouble concentrating 0 2 1 2 2   Moving slowly or fidgety/restless 0 0 0 1 1  Suicidal thoughts 1 1 0 1 1  PHQ-9 Score 6 14 11 16 17   Difficult doing work/chores Somewhat difficult Somewhat difficult Somewhat difficult  Somewhat difficult      10/24/2023    1:13 PM 09/29/2023    8:26 AM 03/31/2023   10:39 AM 01/20/2023    9:44 AM  GAD 7 : Generalized Anxiety Score  Nervous, Anxious, on Edge 1 2 1 3   Control/stop worrying 1 2 1 2   Worry too much - different things 1 2 2 2   Trouble relaxing 0 1 1 1   Restless 0 1 1 2   Easily annoyed or irritable 0 1 2 1   Afraid - awful might happen 0 2 1 3   Total GAD 7 Score 3 11 9 14   Anxiety Difficulty Somewhat difficult Somewhat difficult Somewhat difficult     Suicidal/Homicidal: Nowithout intent/plan  Therapist Response: Clinician utilized CBT, MI, and solution focused interventions to support pt in navigating presenting problems and sxs. Engaged pt in check-in, assessed presenting moods and affect.  - Re-administered PHQ-9 and GAD-7, engaging pt in processing scores, reflecting further on variances in scores over past month, and identifying contributing factors to reductions. - Revisited previously explored concerns surrounding medications, evoking pt's thoughts and feelings surrounding resuming medications and observed sxs.  - Actively listened to pt's recounts of recent events, eliciting further details surrounding events and recent stressors and observed sxs surrounding stressors, further evoking pt's thoughts, feelings, and perspectives related to recent events and not progress.  Clinician reassessed severity of presenting sxs, and presence of any safety concerns. Therapist  provided support and empathy to patient during session.  Plan: Return again in 2-3 weeks.  Diagnosis:  Encounter Diagnoses  Name Primary?   Moderate episode of recurrent major depressive disorder (HCC) Yes   Generalized anxiety disorder    Personality disorder in adult [facility])     Collaboration of Care: Psychiatrist AEB provider notes available in EHR.  Patient/Guardian was advised Release of Information must be obtained prior to any record release in order to collaborate their care with an outside provider. Patient/Guardian was advised if they have not already done so to contact the registration department to sign all necessary forms in order for [place] to release information regarding their care.   Consent: Patient/Guardian gives verbal consent for treatment and assignment of benefits for services provided during this visit. Patient/Guardian expressed understanding and agreed to proceed.   Leisa Lenz, MSW, LCSW 10/24/2023,  1:28 PM

## 2023-10-31 ENCOUNTER — Ambulatory Visit (HOSPITAL_COMMUNITY): Payer: BLUE CROSS/BLUE SHIELD | Admitting: Licensed Clinical Social Worker

## 2023-11-08 ENCOUNTER — Ambulatory Visit (INDEPENDENT_AMBULATORY_CARE_PROVIDER_SITE_OTHER): Payer: MEDICAID | Admitting: Licensed Clinical Social Worker

## 2023-11-08 DIAGNOSIS — F411 Generalized anxiety disorder: Secondary | ICD-10-CM

## 2023-11-08 DIAGNOSIS — F609 Personality disorder, unspecified: Secondary | ICD-10-CM

## 2023-11-08 DIAGNOSIS — F331 Major depressive disorder, recurrent, moderate: Secondary | ICD-10-CM

## 2023-11-08 NOTE — Progress Notes (Signed)
 THERAPIST PROGRESS NOTE   Session Date: 11/08/2023  Session Time: 1115 - 1159  Participation Level: Active  Behavioral Response: CasualAlertEuthymic  Type of Therapy: Individual Therapy  Treatment Goals addressed:  - LTG: Reduce frequency, intensity, and duration of depression symptoms so that daily functioning is improved (OP Depression) - LTG: Increase coping skills to manage depression and improve ability to perform daily activities (OP Depression) - STG: Chadley will identify cognitive patterns and beliefs that support depression (OP Depression) - LTG: "I want to see improvements in my moods AEB doing more things I enjoy and feeling joy out of life" (OP Depression) - STG: Report a decrease in anxiety symptoms as evidenced by an overall reduction in anxiety score by a minimum of 25% on the Generalized Anxiety Disorder Scale (GAD-7) (Anxiety) - LTG: "I'd like to worry less about just everything" (Anxiety)  ProgressTowards Goals: Progressing  Interventions: CBT, Solution Focused, and Supportive  Summary: Erhard is a 23 y.o. male with past psych history of MDD, GAD, and Personality D/O in Adult, presenting for follow-up in efforts to improve management of depressive and anxious symptoms. Patient actively engaged in session, presenting in pleasant moods and congruent affect throughout.  - Engaged in reassessment of presenting depressive and anxious sxs via PHQ-9 and GAD-7 screenings, engaging further in processing of minor increases in depressive and anxious scores, further exploring identified factors. - Pt detailed factors related to recent stressors surrounding job and previously afforded Production designer, theatre/television/film opportunity not being pursued any further resulting in increased depressive feelings. Further processed pt's thoughts and feelings surrounding stress, sharing of considering alternate employment options. Pt further shared contributing factors to feeling down being of time spent recently  reflecting on prior romantic relationship challenges, challenging relationship with close friend whom pt and family shared residences with for 8+ years and creating distance approximately 64mo ago due to inappropriateness of friends behaviors towards others. - Briefly explored importance of boundaries within relationships and how boundaries support in ensuring one feels safe, secure, and supported, as well as communication methods within relationships and identified for area of continued discussion.   Patient responded well to interventions. Patient continues to meet criteria for MDD, GAD, and Personality D/O in Adult. Patient will continue to benefit from engagement in outpatient therapy due to being the least restrictive service to meet presenting needs.      11/08/2023   11:17 AM 10/24/2023    1:15 PM 09/29/2023    8:27 AM 03/31/2023   10:37 AM 01/20/2023    9:44 AM  Depression screen PHQ 2/9  Decreased Interest 2 1 2 2 2   Down, Depressed, Hopeless 2 1 2 2 3   PHQ - 2 Score 4 2 4 4 5   Altered sleeping 1 0 2 1 2   Tired, decreased energy 2 1 2 2 2   Change in appetite 0 1 1 1 1   Feeling bad or failure about yourself  1 1 2 2 2   Trouble concentrating 1 0 2 1 2   Moving slowly or fidgety/restless 0 0 0 0 1  Suicidal thoughts 1 1 1  0 1  PHQ-9 Score 10 6 14 11 16   Difficult doing work/chores Somewhat difficult Somewhat difficult Somewhat difficult Somewhat difficult       11/08/2023   11:15 AM 10/24/2023    1:13 PM 09/29/2023    8:26 AM 03/31/2023   10:39 AM  GAD 7 : Generalized Anxiety Score  Nervous, Anxious, on Edge 1 1 2 1   Control/stop worrying 1 1 2  1  Worry too much - different things 1 1 2 2   Trouble relaxing 0 0 1 1  Restless 1 0 1 1  Easily annoyed or irritable 0 0 1 2  Afraid - awful might happen 0 0 2 1  Total GAD 7 Score 4 3 11 9   Anxiety Difficulty Somewhat difficult Somewhat difficult Somewhat difficult Somewhat difficult    Suicidal/Homicidal: Nowithout  intent/plan  Therapist Response: Clinician utilized CBT, MI, and solution focused interventions to support pt in navigating presenting problems and sxs. Engaged pt in check-in, assessed presenting moods and affect.  -Reassess presenting depressive and anxious symptoms.  PHQ-9 and GAD-7, engaging patient in processing current scores, further eliciting patient's thoughts and perspective surrounding variances and minimal increase in anxious symptoms, and minor increase in depressive symptoms. - Actively listened to pt's recounts of recent events, further eliciting pt's thought and perspectives surrounding recent stressors, presenting sxs, and management. - Utilized socratic questioning to support pt in navigating thoughts in relation to recent and hx of events, processing thoughts and feelings, and supporting pt in identifying factors expressed to be of importance to him.  Clinician reassessed severity of presenting sxs, and presence of any safety concerns. Therapist provided support and empathy to patient during session.  Plan: Return again in 2-3 weeks.  Diagnosis:  Encounter Diagnoses  Name Primary?   Moderate episode of recurrent major depressive disorder (HCC) Yes   Generalized anxiety disorder    Personality disorder in adult [facility])      Collaboration of Care: Psychiatrist AEB provider notes available in EHR.  Patient/Guardian was advised Release of Information must be obtained prior to any record release in order to collaborate their care with an outside provider. Patient/Guardian was advised if they have not already done so to contact the registration department to sign all necessary forms in order for [place] to release information regarding their care.   Consent: Patient/Guardian gives verbal consent for treatment and assignment of benefits for services provided during this visit. Patient/Guardian expressed understanding and agreed to proceed.   Leisa Lenz, MSW, LCSW 11/08/2023,  11:21  AM

## 2023-11-14 ENCOUNTER — Ambulatory Visit (HOSPITAL_COMMUNITY): Payer: BLUE CROSS/BLUE SHIELD | Admitting: Licensed Clinical Social Worker

## 2023-11-15 ENCOUNTER — Other Ambulatory Visit (HOSPITAL_COMMUNITY): Payer: Self-pay

## 2023-11-15 ENCOUNTER — Other Ambulatory Visit: Payer: Self-pay

## 2023-11-15 ENCOUNTER — Telehealth (HOSPITAL_BASED_OUTPATIENT_CLINIC_OR_DEPARTMENT_OTHER): Payer: MEDICAID | Admitting: Student

## 2023-11-15 DIAGNOSIS — F609 Personality disorder, unspecified: Secondary | ICD-10-CM

## 2023-11-15 DIAGNOSIS — F411 Generalized anxiety disorder: Secondary | ICD-10-CM | POA: Diagnosis not present

## 2023-11-15 DIAGNOSIS — F331 Major depressive disorder, recurrent, moderate: Secondary | ICD-10-CM

## 2023-11-15 MED ORDER — ESCITALOPRAM OXALATE 10 MG PO TABS
10.0000 mg | ORAL_TABLET | Freq: Every day | ORAL | 0 refills | Status: DC
Start: 2023-11-15 — End: 2024-01-15
  Filled 2023-11-15: qty 90, 90d supply, fill #0

## 2023-11-15 MED ORDER — ARIPIPRAZOLE 5 MG PO TABS
2.5000 mg | ORAL_TABLET | Freq: Every day | ORAL | 0 refills | Status: DC
Start: 2023-11-15 — End: 2024-01-15
  Filled 2023-11-15: qty 45, 90d supply, fill #0

## 2023-11-15 MED ORDER — HYDROXYZINE HCL 10 MG PO TABS
10.0000 mg | ORAL_TABLET | Freq: Three times a day (TID) | ORAL | 0 refills | Status: DC | PRN
Start: 2023-12-12 — End: 2024-06-03
  Filled 2023-11-15: qty 90, 30d supply, fill #0
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
## 2023-11-17 NOTE — Addendum Note (Signed)
 Addended by: Everlena Cooper on: 11/17/2023 12:16 PM   Modules accepted: Level of Service

## 2023-11-20 ENCOUNTER — Other Ambulatory Visit: Payer: Self-pay

## 2023-11-21 ENCOUNTER — Ambulatory Visit (HOSPITAL_COMMUNITY): Payer: MEDICAID | Admitting: Licensed Clinical Social Worker

## 2023-11-21 DIAGNOSIS — F331 Major depressive disorder, recurrent, moderate: Secondary | ICD-10-CM

## 2023-11-21 DIAGNOSIS — F411 Generalized anxiety disorder: Secondary | ICD-10-CM
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
## 2023-11-27 ENCOUNTER — Ambulatory Visit (HOSPITAL_COMMUNITY): Payer: BLUE CROSS/BLUE SHIELD | Admitting: Licensed Clinical Social Worker

## 2023-11-27 ENCOUNTER — Encounter (HOSPITAL_COMMUNITY): Payer: Self-pay

## 2023-12-04 ENCOUNTER — Ambulatory Visit (INDEPENDENT_AMBULATORY_CARE_PROVIDER_SITE_OTHER): Payer: MEDICAID | Admitting: Licensed Clinical Social Worker

## 2023-12-04 DIAGNOSIS — Z91199 Patient's noncompliance with other medical treatment and regimen due to unspecified reason: Secondary | ICD-10-CM

## 2023-12-04 NOTE — Progress Notes (Signed)
 THERAPIST PROGRESS NOTE   Session Date: 12/04/2023  Session Time: 0900 - 0912  Riley Smith    Clinician attempted to connect with patient for scheduled appointment via Careg[facility] video, sending text request 2x, and email 1x, with no responses. Clinician left message for patient to call office to reschedule therapy appointment.     Attempt 1: Text: [id number] (Text sent to [phone])   Attempt 2: email: [id number] (Email sent to [email or web address])    Attempt 3: Text: [id number] (Text sent to [phone])   Left video chat open until:  0912     Per [facility] policy, after multiple attempts to reach patient unsuccessfully at appointed time, visit will be coded as a no show.  Leisa Lenz, MSW, LCSW 12/04/2023,  9:12 AM

## 2023-12-19 ENCOUNTER — Ambulatory Visit (INDEPENDENT_AMBULATORY_CARE_PROVIDER_SITE_OTHER): Payer: MEDICAID | Admitting: Licensed Clinical Social Worker

## 2023-12-19 DIAGNOSIS — F411 Generalized anxiety disorder: Secondary | ICD-10-CM

## 2023-12-19 DIAGNOSIS — F331 Major depressive disorder, recurrent, moderate: Secondary | ICD-10-CM | POA: Diagnosis not present

## 2023-12-19 DIAGNOSIS — F609 Personality disorder, unspecified: Secondary | ICD-10-CM | POA: Diagnosis not present
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
## 2024-01-01 ENCOUNTER — Ambulatory Visit (INDEPENDENT_AMBULATORY_CARE_PROVIDER_SITE_OTHER): Payer: MEDICAID | Admitting: Licensed Clinical Social Worker

## 2024-01-01 DIAGNOSIS — F609 Personality disorder, unspecified: Secondary | ICD-10-CM | POA: Diagnosis not present

## 2024-01-01 DIAGNOSIS — F411 Generalized anxiety disorder: Secondary | ICD-10-CM | POA: Diagnosis not present

## 2024-01-01 DIAGNOSIS — F331 Major depressive disorder, recurrent, moderate: Secondary | ICD-10-CM | POA: Diagnosis not present

## 2024-01-01 NOTE — Progress Notes (Signed)
 THERAPIST PROGRESS NOTE   Session Date: 01/01/2024  Session Time: 1315 - 1345 Virtual Visit via Video Note  I connected with Winferd Hatter on 01/01/24 at  1:15 PM EDT by a video enabled telemedicine application and verified that I am speaking with the correct person using two identifiers.  Location: Patient: Home Provider: Home Office   I discussed the limitations of evaluation and management by telemedicine and the availability of in person appointments. The patient expressed understanding and agreed to proceed.   The patient was advised to call back or seek an in-person evaluation if the symptoms worsen or if the condition fails to improve as anticipated.  I provided 30 minutes of non-face-to-face time during this encounter.  Participation Level: Active  Behavioral Response: CasualAlertAnxious  Type of Therapy: Individual Therapy  Treatment Goals addressed:  - LTG: Reduce frequency, intensity, and duration of depression symptoms so that daily functioning is improved (OP Depression) - LTG: Increase coping skills to manage depression and improve ability to perform daily activities (OP Depression) - STG: Rayder will identify cognitive patterns and beliefs that support depression (OP Depression) - LTG: "I want to see improvements in my moods AEB doing more things I enjoy and feeling joy out of life" (OP Depression) - STG: Report a decrease in anxiety symptoms as evidenced by an overall reduction in anxiety score by a minimum of 25% on the Generalized Anxiety Disorder Scale (GAD-7) (Anxiety) - LTG: "I'd like to worry less about just everything" (Anxiety)  ProgressTowards Goals: Progressing  Interventions: CBT, Solution Focused, and Supportive  Summary: Kingstin is a 23 y.o. male with past psych history of MDD, GAD, and Personality D/O in Adult, presenting for follow-up in efforts to improve management of depressive and anxious symptoms.   The patient actively engaged in the  session, presenting an overall pleasant moods with congruent affect throughout, sharing of experiencing technical difficulties in relation to connection, continuing to experience poor connectivity throughout visit.  Patient actively engaged in introductory checking, sharing of things being "up and down", noting of having to work later this afternoon.  Patient proved receptive to engaging in 3 months review of individualized goals outlined in treatment plan, actively engaging in assessing recent observed depressive and anxious symptoms via PHQ-9 and GAD-7 and efforts to support and overall reflection of progress throughout treatment to date.  Patient actively engaged in reflection of identified treatment goals specific to anxiety and depression, expressing thoughts and feelings related to overall progression towards all individualized treatment goals, noting of specific progressions towards the reduction of depressive symptoms, increase of coping skills, and improvements in moods.  Briefly engaged in review of upcoming scheduled visits, noting of having minimal appointments scheduled moving forward, with patient sharing the need to revisit schedule at next session due to beginning new job within the coming weeks.  Patient responded well to interventions. Patient continues to meet criteria for MDD, GAD, and Personality D/O in Adult. Patient will continue to benefit from engagement in outpatient therapy due to being the least restrictive service to meet presenting needs.      01/01/2024    1:28 PM 12/19/2023    1:12 PM 11/21/2023    1:09 PM 11/08/2023   11:17 AM 10/24/2023    1:15 PM  Depression screen PHQ 2/9  Decreased Interest 0 1 1 2 1   Down, Depressed, Hopeless 1 2 1 2 1   PHQ - 2 Score 1 3 2 4 2   Altered sleeping 1 2 2 1  0  Tired, decreased energy 1 2 1 2 1   Change in appetite 0 0 0 0 1  Feeling bad or failure about yourself  1 1 1 1 1   Trouble concentrating 0 0 0 1 0  Moving slowly or  fidgety/restless 0 0 0 0 0  Suicidal thoughts 0 0 1 1 1   PHQ-9 Score 4 8 7 10 6   Difficult doing work/chores Somewhat difficult Somewhat difficult Somewhat difficult Somewhat difficult Somewhat difficult      01/01/2024    1:26 PM 12/19/2023    1:10 PM 11/21/2023    1:12 PM 11/08/2023   11:15 AM  GAD 7 : Generalized Anxiety Score  Nervous, Anxious, on Edge 1 1 1 1   Control/stop worrying 1 1 1 1   Worry too much - different things 1 1 1 1   Trouble relaxing 0 0 0 0  Restless 0 0 1 1  Easily annoyed or irritable 1 0 0 0  Afraid - awful might happen 1 1 1  0  Total GAD 7 Score 5 4 5 4   Anxiety Difficulty Somewhat difficult Somewhat difficult Somewhat difficult Somewhat difficult    Suicidal/Homicidal: NAwithout intent/plan.  Therapist Response: Clinician utilized CBT, MI, and solution focused interventions to support pt in navigating presenting problems and sxs.   Clinician actively greeted patient upon joining session, engaging in introductory check-in, assessing presenting mood and affect, eliciting further details of daily events/plans and factors contributing to presenting moods.  Clinician shared necessary tasks needing completion during today's visit with the patient, further prompting engagement in reassessing presenting depressive and anxious symptoms over recent weeks via PHQ-9 and GAD-7, engaging further in reflection of minimal increase in anxious symptoms, and 50% reduction in depressive symptoms, further utilizing factors related to observe symptoms and support in reflecting on overall progress throughout treatment thus far towards individualized treatment goals.  Briefly engaged with patient in exploration of desires for scheduling future visits, supporting patient in processing needs to revisit potential schedule changes in the coming weeks with having started a new job.  Clinician reassessed severity of presenting sxs, and presence of any safety concerns. Therapist provided support  and empathy to patient during session.  Plan: Return again in 2-3 weeks.  Diagnosis:  Encounter Diagnoses  Name Primary?   Moderate episode of recurrent major depressive disorder (HCC) Yes   Generalized anxiety disorder    Personality disorder in adult [facility])         Collaboration of Care: Psychiatrist AEB provider notes available in EHR.  Patient/Guardian was advised Release of Information must be obtained prior to any record release in order to collaborate their care with an outside provider. Patient/Guardian was advised if they have not already done so to contact the registration department to sign all necessary forms in order for us  to release information regarding their care.   Consent: Patient/Guardian gives verbal consent for treatment and assignment of benefits for services provided during this visit. Patient/Guardian expressed understanding and agreed to proceed.   Patsi Boots, MSW, LCSW 01/01/2024,  1:29 PM

## 2024-01-08 ENCOUNTER — Encounter (HOSPITAL_COMMUNITY): Payer: Self-pay | Admitting: *Deleted

## 2024-01-08 ENCOUNTER — Ambulatory Visit (HOSPITAL_COMMUNITY)
Admission: EM | Admit: 2024-01-08 | Discharge: 2024-01-08 | Disposition: A | Discharge status: Home / Self Care | Payer: MEDICAID | Attending: Emergency Medicine | Admitting: Emergency Medicine

## 2024-01-08 DIAGNOSIS — K529 Noninfective gastroenteritis and colitis, unspecified: Secondary | ICD-10-CM

## 2024-01-08 DIAGNOSIS — M62838 Other muscle spasm: Secondary | ICD-10-CM

## 2024-01-08 DIAGNOSIS — M549 Dorsalgia, unspecified: Secondary | ICD-10-CM | POA: Diagnosis not present

## 2024-01-08 MED ORDER — ONDANSETRON 4 MG PO TBDP
4.0000 mg | ORAL_TABLET | Freq: Four times a day (QID) | ORAL | 0 refills | Status: AC | PRN
  Filled 2024-01-08: qty 20, 5d supply, fill #0

## 2024-01-08 MED ORDER — ONDANSETRON 4 MG PO TBDP
4.0000 mg | ORAL_TABLET | Freq: Once | ORAL | Status: AC
  Administered 2024-01-08: 4 mg via ORAL

## 2024-01-08 MED ORDER — ONDANSETRON 4 MG PO TBDP
ORAL_TABLET | ORAL | Status: AC
  Filled 2024-01-08: qty 1

## 2024-01-08 MED ORDER — CYCLOBENZAPRINE HCL 10 MG PO TABS
10.0000 mg | ORAL_TABLET | Freq: Two times a day (BID) | ORAL | 0 refills | Status: AC | PRN
  Filled 2024-01-08: qty 20, 10d supply, fill #0

## 2024-01-08 NOTE — ED Triage Notes (Signed)
 Pt states he has some nausea and vomited today around 1-2pm.   He also states that he has bilateral shoulder pain X 4 days. He states he took ASA pain is worse when he is moving to look down at his phone.

## 2024-01-08 NOTE — Discharge Instructions (Signed)
 The zofran  can be used every 6 hours as needed to settle the stomach Drink lots of fluids!  You can take the muscle relaxer (Flexeril) twice daily. If the medication makes you drowsy, take only at bed time. Tylenol  can be used for pain If your nausea/vomiting resolves and you can eat something, ibuprofen can also be used

## 2024-01-08 NOTE — ED Provider Notes (Signed)
 [facility]    CSN: [id number] Arrival date & time: 01/08/24  1816      History   Chief Complaint Chief Complaint  Patient presents with   Emesis   Shoulder Pain    HPI Riley Smith is a 23 y.o. male.  2 episodes of emesis after breakfast this morning, and 1 more episode in the afternoon. Still some nausea. Has tolerated fluids. No abdominal pain, denies diarrhea or fever Possible sick contact with stomach bug  Also bilateral upper back pain, tenderness for about 4 days. Currently rating 3/10 with movement. No known injury or trauma. Not having weakness or paresthesias of extremities. 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 Authorizing Provider  ARIPiprazole  (ABILIFY ) 5 MG tablet Take 0.5 tablets (2.5 mg total) by mouth daily. 11/15/23  Yes Augusta Blizzard, MD  cyclobenzaprine (FLEXERIL) 10 MG tablet Take 1 tablet (10 mg total) by mouth 2 (two) times daily as needed for muscle spasms. 01/08/24  Yes Aryn Safran, Ivette Marks, PA-C  escitalopram  (LEXAPRO ) 10 MG tablet Take 1 tablet (10 mg total) by mouth daily. 11/15/23  Yes Augusta Blizzard, MD  ondansetron  (ZOFRAN -ODT) 4 MG disintegrating tablet Take 1 tablet (4 mg total) by  mouth every 6 (six) hours as needed for nausea or vomiting. 01/08/24  Yes Florentina Marquart, Ivette Marks, PA-C  hydrOXYzine  (ATARAX ) 10 MG tablet Take 1 tablet (10 mg total) by mouth 3 (three) times daily as needed. 12/12/23   Augusta Blizzard, MD  loratadine  (CLARITIN ) 10 MG tablet Take 1 tablet (10 mg total) by mouth daily. 01/20/23   Marius Siemens, NP  Multiple Vitamins-Iron (DAILY MULTIVITAMINS/IRON PO) Take 1 tablet by mouth daily.    [provider]    Family History Family History  Problem Relation Age of Onset   Healthy Mother    Depression Mother    Healthy Father     Social History Social History   Tobacco Use   Smoking status: Never   Smokeless tobacco: Never  Vaping Use   Vaping status: Never Used  Substance Use Topics   Alcohol use: Yes    Comment: occ   Drug use: Never     Allergies   Patient has no known allergies.   Review of Systems Review of Systems Per HPI  Physical Exam Triage Vital Signs ED Triage Vitals [01/08/24 1956]  Encounter Vitals Group     BP 96/67     Systolic BP Percentile      Diastolic BP Percentile      Pulse Rate 67     Resp 18     Temp 98.7 F (37.1 C)     Temp Source Oral  SpO2 97 %     Weight      Height      Head Circumference      Peak Flow      Pain Score 3     Pain Loc      Pain Education      Exclude from Growth Chart    No data found.  Updated Vital Signs BP 96/67 (BP Location: Left Arm)   Pulse 67   Temp 98.7 F (37.1 C) (Oral)   Resp 18   SpO2 97%   Physical Exam Vitals and nursing note reviewed.  Constitutional:      Appearance: Normal appearance.  HENT:     Mouth/Throat:     Mouth: Mucous membranes are moist.     Pharynx: Oropharynx is clear.  Eyes:     Conjunctiva/sclera: Conjunctivae normal.  Cardiovascular:     Rate and Rhythm: Normal rate and regular rhythm.     Heart sounds: Normal heart sounds.  Pulmonary:     Effort: Pulmonary effort is normal.     Breath sounds: Normal breath sounds.   Abdominal:     Palpations: Abdomen is soft.     Tenderness: There is no abdominal tenderness. There is no right CVA tenderness, left CVA tenderness, guarding or rebound.  Musculoskeletal:        General: Normal range of motion.     Cervical back: Spasms and tenderness present.       Back:     Comments: Muscular tenderness and tightness of bilateral trapezius. No bony tenderness C-L spine. Full ROM at neck without pain. Strength and sensation intact throughout. Full ROM of extremities   Skin:    General: Skin is warm and dry.  Neurological:     Mental Status: He is alert and oriented to person, place, and time.     UC Treatments / Results  Labs (all labs ordered are listed, but only abnormal results are displayed) Labs Reviewed - No data to display  EKG  Radiology No results found.  Procedures Procedures  Medications Ordered in UC Medications  ondansetron  (ZOFRAN -ODT) disintegrating tablet 4 mg (4 mg Oral Given 01/08/24 1959)    Initial Impression / Assessment and Plan / UC Course  I have reviewed the triage vital signs and the nursing notes.  Pertinent labs & imaging results that were available during my care of the patient were reviewed by me and considered in my medical decision making (see chart for details).  Stable vitals, well appearing.  Zofran  ODT given for nausea with resolution. Tolerating fluids. No abdominal tenderness on exam. Sent zofran  to pharmacy, use q6 hours prn, increase fluids, bland diet.   Mild upper back muscular pain. No red flags. Try tylenol . Ibuprofen if nausea/vomiting resolves and he can eat something first. Flexeril BID prn; drowsy precautions. Can return if needed or see PCP Patient agrees to plan, no questions Note for work provided  Final Clinical Impressions(s) / UC Diagnoses   Final diagnoses:  Gastroenteritis  Upper back pain  Muscle spasm     Discharge Instructions      The zofran  can be used every 6 hours as needed to  settle the stomach Drink lots of fluids!  You can take the muscle relaxer (Flexeril) twice daily. If the medication makes you drowsy, take only at bed time. Tylenol  can be used for pain If your nausea/vomiting resolves and you can eat something, ibuprofen can also be used    ED Prescriptions  Medication Sig Dispense Auth. Provider   ondansetron  (ZOFRAN -ODT) 4 MG disintegrating tablet Take 1 tablet (4 mg total) by mouth every 6 (six) hours as needed for nausea or vomiting. 20 tablet Aubryana Vittorio, PA-C   cyclobenzaprine (FLEXERIL) 10 MG tablet Take 1 tablet (10 mg total) by mouth 2 (two) times daily as needed for muscle spasms. 20 tablet Alexzander Dolinger, Ivette Marks, PA-C      PDMP not reviewed this encounter.   Newton Barer 01/08/24 2118

## 2024-01-09 ENCOUNTER — Other Ambulatory Visit: Payer: Self-pay
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
## 2024-01-15 ENCOUNTER — Ambulatory Visit (INDEPENDENT_AMBULATORY_CARE_PROVIDER_SITE_OTHER): Payer: MEDICAID | Admitting: Licensed Clinical Social Worker

## 2024-01-15 ENCOUNTER — Ambulatory Visit (HOSPITAL_BASED_OUTPATIENT_CLINIC_OR_DEPARTMENT_OTHER): Payer: MEDICAID | Admitting: Student

## 2024-01-15 ENCOUNTER — Encounter (HOSPITAL_COMMUNITY): Payer: Self-pay | Admitting: Student

## 2024-01-15 ENCOUNTER — Other Ambulatory Visit: Payer: Self-pay

## 2024-01-15 DIAGNOSIS — F331 Major depressive disorder, recurrent, moderate: Secondary | ICD-10-CM

## 2024-01-15 DIAGNOSIS — F609 Personality disorder, unspecified: Secondary | ICD-10-CM

## 2024-01-15 DIAGNOSIS — F411 Generalized anxiety disorder: Secondary | ICD-10-CM | POA: Diagnosis not present

## 2024-01-15 MED ORDER — ESCITALOPRAM OXALATE 10 MG PO TABS
10.0000 mg | ORAL_TABLET | Freq: Every day | ORAL | 0 refills | Status: DC
  Filled 2024-01-15 – 2024-03-08 (×3): qty 90, 90d supply, fill #0
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
## 2024-01-16 NOTE — Addendum Note (Signed)
 Addended by: Donnelly Gainer on: 01/16/2024 08:29 AM   Modules accepted: Level of Service

## 2024-01-23 ENCOUNTER — Other Ambulatory Visit: Payer: Self-pay

## 2024-01-31 ENCOUNTER — Encounter (HOSPITAL_COMMUNITY): Payer: Self-pay

## 2024-01-31 ENCOUNTER — Ambulatory Visit (INDEPENDENT_AMBULATORY_CARE_PROVIDER_SITE_OTHER): Payer: MEDICAID | Admitting: Licensed Clinical Social Worker

## 2024-01-31 DIAGNOSIS — Z91199 Patient's noncompliance with other medical treatment and regimen due to unspecified reason: Secondary | ICD-10-CM

## 2024-01-31 NOTE — Progress Notes (Signed)
 THERAPIST PROGRESS NOTE   Session Date: 01/31/2024  Session Time: 1300  Winferd Hatter    Clinician attempted to connect with patient for scheduled appointment via Caregility video, sending text request x3 with no response.     Attempt 1: Text: 1305   Attempt 2: Text: 1308    Attempt 3: Text: 1310   Disconnected video visit at:  1312     Per [place] policy, after multiple attempts to reach patient unsuccessfully at appointed time, visit will be coded as a no show.  Patsi Boots, MSW, LCSW 01/31/2024,  1:13 PM

## 2024-02-16 ENCOUNTER — Ambulatory Visit (HOSPITAL_COMMUNITY): Payer: MEDICAID | Admitting: Licensed Clinical Social Worker

## 2024-02-16 DIAGNOSIS — F609 Personality disorder, unspecified: Secondary | ICD-10-CM | POA: Diagnosis not present

## 2024-02-16 DIAGNOSIS — F331 Major depressive disorder, recurrent, moderate: Secondary | ICD-10-CM | POA: Diagnosis not present

## 2024-02-16 DIAGNOSIS — F411 Generalized anxiety disorder: Secondary | ICD-10-CM

## 2024-02-16 NOTE — Progress Notes (Signed)
 THERAPIST PROGRESS NOTE   Session Date: 02/16/2024  Session Time: 1005 - 1054 Virtual Visit via Video Note  I connected with Riley Smith on 02/16/24 at 10:00 AM EDT by a video enabled telemedicine application and verified that I am speaking with the correct person using two identifiers.  Location: Patient: Home Provider: Home Office   I discussed the limitations of evaluation and management by telemedicine and the availability of in person appointments. The patient expressed understanding and agreed to proceed.   The patient was advised to call back or seek an in-person evaluation if the symptoms worsen or if the condition fails to improve as anticipated.  I provided 49 minutes of non-face-to-face time during this encounter.  Participation Level: Active  Behavioral Response: CasualAlertDepressed and Euthymic  Type of Therapy: Individual Therapy  Treatment Goals addressed:  - LTG: Reduce frequency, intensity, and duration of depression symptoms so that daily functioning is improved (OP Depression) - LTG: Increase coping skills to manage depression and improve ability to perform daily activities (OP Depression) - STG: Strother will identify cognitive patterns and beliefs that support depression (OP Depression) - LTG: "I want to see improvements in my moods AEB doing more things I enjoy and feeling joy out of life" (OP Depression) - STG: Report a decrease in anxiety symptoms as evidenced by an overall reduction in anxiety score by a minimum of 25% on the Generalized Anxiety Disorder Scale (GAD-7) (Anxiety) - LTG: "I'd like to worry less about just everything" (Anxiety)  ProgressTowards Goals: Not Progressing  Interventions: CBT, Solution Focused, and Supportive  Summary: Riley Smith is a 23 y.o. male with past psych history of MDD, GAD, and Personality D/O in Adult, presenting for follow-up in efforts to improve management of depressive and anxious symptoms.   Pt actively engaged in  session, presenting in primarily depressed moods throughout duration of visit. Pt engaged in introductory check-in, sharing of having lost job the day after last visit due to having gotten sick at work and employer finding pt being sick a concern leading to termination. Pt further detailed things having been "up and down", sharing of most recent girl pt had established relationship with having "ghosted" pt, reflecting on having felt increased distance over weeks leading up to ending of relationship. Pt shared of some days being better than others related to his head space, sometimes laying in bed for an hr before getting up and not feeling like doing anything, with other days feeling a bit more energetic and hopeful. Further reflected on increased conflict within the home with aunt having been at the home for the past few months, noting of conflict between aunt and grandmother, and pt's own efforts at managing distracting self from stress. Shared of not having increased urgency finding job at this time and wanting to spend time processing challenging events/stressors and associated emotions from the past year. Pt detailed various efforts at coping with stress to include coloring, tending to needs around the home, and supporting outside animals.  Patient responded well to interventions. Patient continues to meet criteria for MDD, GAD, and Personality D/O in Adult. Patient will continue to benefit from engagement in outpatient therapy due to being the least restrictive service to meet presenting needs.      02/16/2024   10:45 AM 01/01/2024    1:28 PM 12/19/2023    1:12 PM 11/21/2023    1:09 PM 11/08/2023   11:17 AM  Depression screen PHQ 2/9  Decreased Interest 1 0 1 1 2  Down, Depressed, Hopeless 1 1 2 1 2   PHQ - 2 Score 2 1 3 2 4   Altered sleeping 3 1 2 2 1   Tired, decreased energy 3 1 2 1 2   Change in appetite 0 0 0 0 0  Feeling bad or failure about yourself  1 1 1 1 1   Trouble concentrating 1 0 0 0 1   Moving slowly or fidgety/restless 0 0 0 0 0  Suicidal thoughts 1 0 0 1 1  PHQ-9 Score 11 4 8 7 10   Difficult doing work/chores Somewhat difficult Somewhat difficult Somewhat difficult Somewhat difficult Somewhat difficult      02/16/2024   10:40 AM 01/01/2024    1:26 PM 12/19/2023    1:10 PM 11/21/2023    1:12 PM  GAD 7 : Generalized Anxiety Score  Nervous, Anxious, on Edge 2 1 1 1   Control/stop worrying 2 1 1 1   Worry too much - different things 1 1 1 1   Trouble relaxing 0 0 0 0  Restless 0 0 0 1  Easily annoyed or irritable 1 1 0 0  Afraid - awful might happen 1 1 1 1   Total GAD 7 Score 7 5 4 5   Anxiety Difficulty Somewhat difficult Somewhat difficult Somewhat difficult Somewhat difficult    Suicidal/Homicidal: Nowithout intent/plan. Reports pSI earlier today, no current thoughts, denying plan, intent or means.  Therapist Response: Clinician utilized CBT, MI, and solution focused interventions to support pt in navigating presenting problems and sxs.   Clinician openly greeted pt upon joining today's virtual visit, actively assessing pt's presenting moods and affect, engaging pt in introductory check-in, actively listening to pt's brief recounts of past month. Clinician reflected on attendance/commitment concerns, relaying risk of discharge due to non-commitment to tx with further missed visits. Encouraged pt's recounts of events and factors contributing to presenting moods, actively listening to pt's reflections of stressors and challenges experienced throughout the past month, providing support and validation of expressed feelings in relation to events. Engage patient in reassessing of depressive and anxious symptoms via PHQ-9 and GAD-7, noting of mild increase in anxious symptoms and significant increase in depressive symptoms, engaging patient in reflection of observed symptoms and management of such. Utilizing open ended questions to support pt further processing thoughts and feelings in  relation to presenting stressors and processing of more realistic thoughts.  Utilized Socratic questioning to prompt greater critical processing of patient's individual efforts at managing and/or coping with presenting stressors, actively listening to such reports.  Clinician reassessed severity of presenting sxs, and presence of any safety concerns. Therapist provided support and empathy to patient during session.  Homework: Explore increasing frequency of coloring and spending time to self to enable self to let out creativity, focus on what pt is doing instead of surrounding stressors, aiding in mindfulness.  Plan: Return again in 2-3 weeks.  Diagnosis:  Encounter Diagnoses  Name Primary?   Moderate episode of recurrent major depressive disorder (HCC) Yes   Generalized anxiety disorder    Personality disorder in adult [facility])      Collaboration of Care: Psychiatrist AEB provider notes available in EHR.  Patient/Guardian was advised Release of Information must be obtained prior to any record release in order to collaborate their care with an outside provider. Patient/Guardian was advised if they have not already done so to contact the registration department to sign all necessary forms in order for us  to release information regarding their care.   Consent: Patient/Guardian gives verbal consent  for treatment and assignment of benefits for services provided during this visit. Patient/Guardian expressed understanding and agreed to proceed.   Patsi Boots, MSW, LCSW 02/16/2024,  10:48 AM

## 2024-03-01 ENCOUNTER — Ambulatory Visit (INDEPENDENT_AMBULATORY_CARE_PROVIDER_SITE_OTHER): Payer: MEDICAID | Admitting: Licensed Clinical Social Worker

## 2024-03-01 DIAGNOSIS — F411 Generalized anxiety disorder: Secondary | ICD-10-CM | POA: Diagnosis not present

## 2024-03-01 DIAGNOSIS — F331 Major depressive disorder, recurrent, moderate: Secondary | ICD-10-CM

## 2024-03-01 DIAGNOSIS — F609 Personality disorder, unspecified: Secondary | ICD-10-CM | POA: Diagnosis not present

## 2024-03-01 NOTE — Progress Notes (Unsigned)
 THERAPIST PROGRESS NOTE   Session Date: 03/01/2024  Session Time: 1004 - 1102 Virtual Visit via Video Note  I connected with Riley Smith on 03/01/24 at 10:00 AM EDT by a video enabled telemedicine application and verified that I am speaking with the correct person using two identifiers.  Location: Riley Smith: Home Provider: Home Office   I discussed the limitations of evaluation and management by telemedicine and the availability of in person appointments. The Riley Smith expressed understanding and agreed to proceed.   The Riley Smith was advised to call back or seek an in-person evaluation if the symptoms worsen or if the condition fails to improve as anticipated.  I provided 58 minutes of non-face-to-face time during this encounter.  Participation Level: Active  Behavioral Response: CasualAlertDepressed and Euthymic  Type of Therapy: Individual Therapy  Treatment Goals addressed:  - LTG: Reduce frequency, intensity, and duration of depression symptoms so that daily functioning is improved (OP Depression) - LTG: Increase coping skills to manage depression and improve ability to perform daily activities (OP Depression) - STG: Ediel will identify cognitive patterns and beliefs that support depression (OP Depression) - LTG: I want to see improvements in my moods AEB doing more things I enjoy and feeling joy out of life (OP Depression) - STG: Report a decrease in anxiety symptoms as evidenced by an overall reduction in anxiety score by a minimum of 25% on the Generalized Anxiety Disorder Scale (GAD-7) (Anxiety) - LTG: I'd like to worry less about just everything (Anxiety)  ProgressTowards Goals: Progressing  Interventions: CBT, Solution Focused, and Supportive  Summary: Riley Smith is a 23 y.o. male with past psych history of MDD, GAD, and Personality D/O in Adult, presenting for follow-up in efforts to improve management of depressive and anxious symptoms.   Pt actively engaged in  session, presenting in primarily depressed moods throughout duration of visit. Pt engaged in introductory check-in, sharing of feeling tired this morning, having stayed up later, other than that things have been pretty okay, taking things easy, pretty much just doing daily chores, applying for jobs, had a few interviews, one being a scam which pt ended call promptly, and others proving to be unfruitful. Further explored pt's interest in finding employment after having take the last few weeks to reflect on events and process challenges of the past few years surrounding loss of family members, finding these not to affect pt as much as previously. Processed individual perceived benefits of now re-entering workforce, giving self something to do to get out of house and make some money.   Riley Smith responded well to interventions. Riley Smith continues to meet criteria for MDD, GAD, and Personality D/O in Adult. Riley Smith will continue to benefit from engagement in outpatient therapy due to being the least restrictive service to meet presenting needs.      02/16/2024   10:45 AM 01/01/2024    1:28 PM 12/19/2023    1:12 PM 11/21/2023    1:09 PM 11/08/2023   11:17 AM  Depression screen PHQ 2/9  Decreased Interest 1 0 1 1 2   Down, Depressed, Hopeless 1 1 2 1 2   PHQ - 2 Score 2 1 3 2 4   Altered sleeping 3 1 2 2 1   Tired, decreased energy 3 1 2 1 2   Change in appetite 0 0 0 0 0  Feeling bad or failure about yourself  1 1 1 1 1   Trouble concentrating 1 0 0 0 1  Moving slowly or fidgety/restless 0 0 0 0 0  Suicidal thoughts 1 0 0 1 1  PHQ-9 Score 11 4 8 7 10   Difficult doing work/chores Somewhat difficult Somewhat difficult Somewhat difficult Somewhat difficult Somewhat difficult      02/16/2024   10:40 AM 01/01/2024    1:26 PM 12/19/2023    1:10 PM 11/21/2023    1:12 PM  GAD 7 : Generalized Anxiety Score  Nervous, Anxious, on Edge 2 1 1 1   Control/stop worrying 2 1 1 1   Worry too much - different things 1 1 1 1    Trouble relaxing 0 0 0 0  Restless 0 0 0 1  Easily annoyed or irritable 1 1 0 0  Afraid - awful might happen 1 1 1 1   Total GAD 7 Score 7 5 4 5   Anxiety Difficulty Somewhat difficult Somewhat difficult Somewhat difficult Somewhat difficult    Suicidal/Homicidal: Nowithout intent/plan. Reports pSI earlier today, no current thoughts, denying plan, intent or means.  Therapist Response: Clinician utilized CBT, MI, and solution focused interventions to support pt in navigating presenting problems and sxs.   Clinician openly greeted pt upon joining today's virtual visit, actively assessing pt's presenting moods and affect, engaging pt in introductory check-in, actively listening to pt's brief recounts of past month. Clinician reflected on attendance/commitment concerns, relaying risk of discharge due to non-commitment to tx with further missed visits. Encouraged pt's recounts of events and factors contributing to presenting moods, actively listening to pt's reflections of stressors and challenges experienced throughout the past month, providing support and validation of expressed feelings in relation to events. Engage Riley Smith in reassessing of depressive and anxious symptoms via PHQ-9 and GAD-7, noting of mild increase in anxious symptoms and significant increase in depressive symptoms, engaging Riley Smith in reflection of observed symptoms and management of such. Utilizing open ended questions to support pt further processing thoughts and feelings in relation to presenting stressors and processing of more realistic thoughts.  Utilized Socratic questioning to prompt greater critical processing of Riley Smith's individual efforts at managing and/or coping with presenting stressors, actively listening to such reports.  Clinician reassessed severity of presenting sxs, and presence of any safety concerns. Therapist provided support and empathy to Riley Smith during session.  Homework: Explore increasing frequency of  coloring and spending time to self to enable self to let out creativity, focus on what pt is doing instead of surrounding stressors, aiding in mindfulness.  Plan: Return again in 2-3 weeks.  Diagnosis:  Encounter Diagnoses  Name Primary?   Moderate episode of recurrent major depressive disorder (HCC) Yes   Generalized anxiety disorder    Personality disorder in adult [facility])      Collaboration of Care: Psychiatrist AEB provider notes available in EHR.  Riley Smith/Guardian was advised Release of Information must be obtained prior to any record release in order to collaborate their care with an outside provider. Riley Smith/Guardian was advised if they have not already done so to contact the registration department to sign all necessary forms in order for us  to release information regarding their care.   Consent: Riley Smith/Guardian gives verbal consent for treatment and assignment of benefits for services provided during this visit. Riley Smith/Guardian expressed understanding and agreed to proceed.   Patsi Boots, MSW, LCSW 03/01/2024,  10:00 AM

## 2024-03-08 ENCOUNTER — Other Ambulatory Visit (HOSPITAL_COMMUNITY): Payer: Self-pay

## 2024-03-08 ENCOUNTER — Ambulatory Visit (HOSPITAL_COMMUNITY): Payer: MEDICAID | Admitting: Licensed Clinical Social Worker

## 2024-03-08 ENCOUNTER — Other Ambulatory Visit (HOSPITAL_BASED_OUTPATIENT_CLINIC_OR_DEPARTMENT_OTHER): Payer: Self-pay

## 2024-03-08 ENCOUNTER — Other Ambulatory Visit: Payer: Self-pay

## 2024-03-09 ENCOUNTER — Other Ambulatory Visit (HOSPITAL_COMMUNITY): Payer: Self-pay

## 2024-03-12 ENCOUNTER — Ambulatory Visit (INDEPENDENT_AMBULATORY_CARE_PROVIDER_SITE_OTHER): Payer: MEDICAID | Admitting: Licensed Clinical Social Worker

## 2024-03-12 ENCOUNTER — Encounter (HOSPITAL_COMMUNITY): Payer: Self-pay

## 2024-03-12 DIAGNOSIS — Z91199 Patient's noncompliance with other medical treatment and regimen due to unspecified reason: Secondary | ICD-10-CM

## 2024-03-12 NOTE — Progress Notes (Signed)
 THERAPIST PROGRESS NOTE   Session Date: 03/12/2024  Session Time: 1500  Pt contacted office at approx. 1440 to relay of inabilities to present for today's scheduled visit. Pt's next scheduled appt is 03/22/24.   Lynwood JONETTA Maris, MSW, LCSW 03/12/2024,  2:57 PM

## 2024-03-17 NOTE — Progress Notes (Deleted)
 Psychiatric Follow Up Adult Assessment  Date: 03/18/2024, 12:14 PM  Patient Identification: Riley Smith MRN: [id number] DOB: [birth year]   ASSESSMENT / PLAN  THAISON KOLODZIEJSKI is a 23 y.o. male with PMH of MDD, GAD, and unspecified personality disorder who presents for medication management. Patient is a transfer from Dr. Prentice Espy.   Today, ***  Major Depressive Disorder, recurrent episode, moderate Generalized Anxiety Disorder -Continue lexapro  10 mg daily -Continue hydroxyzine  10 mg tid prn   Patient was given contact information for behavioral health clinic and was instructed to call 911 for emergencies.   Interval History  Chief Complaint: Medication Management for depression and anxiety  Patient seen ***.  Patient reports feeling *** today. Since the previous visit, ***. Regarding medications, patient notes ***. Patient reports the following adverse effects: ***. Patient reports *** sleep, ***. Patient reports *** appetite, ***. Regarding psychiatric symptoms, ***. Patient rates anxiety a ***/10, depression a ***/10, and anger a ***/10. Patient denies current SI, HI, and AVH. ***   Life updates: *** Stressors include ***.   Substance use: ***   PAST HISTORY   Past Psychiatric History:  Hospitalizations: twice for SI Suicide attempts: denies Psychotherapy: endorses  Past Medical History: None  Allergies: Patient has no known allergies.   Social History:  Living with: father Income: full-time job at a [employer]  Substance Use History: EtOH:  reports current alcohol use. Sporadic 1-2 shots every other week Nicotine:  reports that he has never smoked. He has never used smokeless tobacco.  Substance Abuse History in the last 12 months:  No.    Past Medical History:  Past Medical History:  Diagnosis Date   Anxiety    Depression    Suicidal behavior     Past Surgical History:  Procedure Laterality Date   No prior surgery      Family  History:  Family History  Problem Relation Age of Onset   Healthy Mother    Depression Mother    Healthy Father     Social History:   Social History   Socioeconomic History   Marital status: Single    Spouse name: Not on file   Number of children: 0   Years of education: Not on file   Highest education level: Some college, no degree  Occupational History   Not on file  Tobacco Use   Smoking status: Never   Smokeless tobacco: Never  Vaping Use   Vaping status: Never Used  Substance and Sexual Activity   Alcohol use: Yes    Comment: occ   Drug use: Never   Sexual activity: Not Currently  Other Topics Concern   Not on file  Social History Narrative   Not on file   Social Drivers of Health   Financial Resource Strain: Low Risk  (01/20/2023)   Overall Financial Resource Strain (CARDIA)    Difficulty of Paying Living Expenses: Not very hard  Recent Concern: Financial Resource Strain - Medium Risk (12/16/2022)   Overall Financial Resource Strain (CARDIA)    Difficulty of Paying Living Expenses: Somewhat hard  Food Insecurity: Food Insecurity Present (01/20/2023)   Hunger Vital Sign    Worried About Running Out of Food in the Last Year: Sometimes true    Ran Out of Food in the Last Year: Never true  Transportation Needs: Unmet Transportation Needs (01/20/2023)   PRAPARE - Administrator, Civil Service (Medical): Yes    Lack of Transportation (Non-Medical): Yes  Physical Activity: Insufficiently Active (01/20/2023)   Exercise Vital Sign    Days of Exercise per Week: 1 day    Minutes of Exercise per Session: 20 min  Stress: Stress Concern Present (01/20/2023)   [employer] of Occupational Health - Occupational Stress Questionnaire    Feeling of Stress : Very much  Social Connections: Socially Isolated (01/20/2023)   Social Connection and Isolation Panel    Frequency of Communication with Friends and Family: Once a week    Frequency of Social Gatherings with  Friends and Family: Never    Attends Religious Services: Never    Database administrator or Organizations: No    Attends Engineer, structural: Not on file    Marital Status: Never married    Allergies: No Known Allergies  Current Medications: Current Outpatient Medications  Medication Sig Dispense Refill   cyclobenzaprine  (FLEXERIL ) 10 MG tablet Take 1 tablet (10 mg total) by mouth 2 (two) times daily as needed for muscle spasms. 20 tablet 0   escitalopram  (LEXAPRO ) 10 MG tablet Take 1 tablet (10 mg total) by mouth daily. 90 tablet 0   hydrOXYzine  (ATARAX ) 10 MG tablet Take 1 tablet (10 mg total) by mouth 3 (three) times daily as needed. 180 tablet 0   loratadine  (CLARITIN ) 10 MG tablet Take 1 tablet (10 mg total) by mouth daily. 30 tablet 11   Multiple Vitamins-Iron (DAILY MULTIVITAMINS/IRON PO) Take 1 tablet by mouth daily.     ondansetron  (ZOFRAN -ODT) 4 MG disintegrating tablet Take 1 tablet (4 mg total) by mouth every 6 (six) hours as needed for nausea or vomiting. 20 tablet 0   No current facility-administered medications for this visit.        OBJECTIVE  There were no vitals taken for this visit.   General Appearance: appears at stated age, casually dressed and groomed ***  Behavior: pleasant and cooperative ***  Psychomotor Activity: no psychomotor agitation or retardation noted ***  Eye Contact: fair *** Speech: normal amount, volume and fluency ***   Mood: euthymic *** Affect: congruent, pleasant and interactive ***  Thought Process: linear, goal directed, no circumstantial or tangential thought process noted, no racing thoughts or flight of ideas *** Descriptions of Associations: intact ***  Thought Content Hallucinations: denies AH, VH , does not appear responding to stimuli *** Delusions: no paranoia, delusions of control, grandeur, ideas of reference, thought broadcasting, and magical thinking *** Suicidal Thoughts: denies SI, intention, plan  *** Homicidal Thoughts: denies HI, intention, plan ***  Alertness/Orientation: alert and fully oriented ***  Insight: fair*** Judgment: fair***  Memory: intact ***  Executive Functions  Concentration: intact *** Attention Span: fair *** Recall: intact *** Fund of Knowledge: fair ***  Physical Exam *** General: Pleasant, well-appearing ***. No acute distress. Pulmonary: Normal effort. No wheezing or rales. Skin: No obvious rash or lesions. Neuro: A&Ox3.No focal deficit.   Review of Systems *** No reported symptoms   Screenings:  AIMS    Flowsheet Row [facility]  AIMS Total Score 0   AUDIT    Flowsheet Row [facility]  Alcohol Use Disorder Identification Test Final Score (AUDIT) 2   GAD-7    Flowsheet Row Counselor from 02/16/2024 in [place] [facility] from 01/01/2024 in [facility] at [facility] from 12/19/2023 in [facility] at [facility]  from 11/21/2023 in [facility] at [facility] from 11/08/2023 in [facility] Out[facility]al Health at [facility]  Total GAD-7 Score 7 5 4 5 4    PHQ2-9    Flowsheet Row Counselor from 02/16/2024 in [facility] at [facility] from 01/01/2024 in [facility] at [facility] from 12/19/2023 in [facility] at [facility] from 11/21/2023 in [facility] at [facility] from 11/08/2023 in [facility] at [facility] Total Score 2 1 3 2 4   PHQ-9 Total Score 11 4 8 7 10    Flowsheet Row Counselor from 02/16/2024 in [place] [facility] at  [facility] from 01/08/2024 in [facility] Care at [facility] from 11/08/2023 in [facility] at [facility] RISK CATEGORY Low Risk No Risk Moderate Risk    Collaboration of Care: Case discussed with current outpatient attending, see attending's attestation for additional information  Ismael Franco, MD PGY-3 Psychiatry Resident

## 2024-03-18 ENCOUNTER — Ambulatory Visit (HOSPITAL_COMMUNITY): Payer: MEDICAID | Admitting: Psychiatry

## 2024-03-22 ENCOUNTER — Ambulatory Visit (INDEPENDENT_AMBULATORY_CARE_PROVIDER_SITE_OTHER): Payer: MEDICAID | Admitting: Licensed Clinical Social Worker

## 2024-03-22 ENCOUNTER — Encounter (HOSPITAL_COMMUNITY): Payer: Self-pay

## 2024-03-22 DIAGNOSIS — F609 Personality disorder, unspecified: Secondary | ICD-10-CM

## 2024-03-22 DIAGNOSIS — F331 Major depressive disorder, recurrent, moderate: Secondary | ICD-10-CM

## 2024-03-22 DIAGNOSIS — F411 Generalized anxiety disorder: Secondary | ICD-10-CM

## 2024-03-22 NOTE — Progress Notes (Unsigned)
 THERAPIST PROGRESS NOTE   Session Date: 03/22/2024  Session Time: 1008 - 1101 Virtual Visit via Video Note  I connected with Riley Smith on 03/22/24 at 10:00 AM EDT by a video enabled telemedicine application and verified that I am speaking with the correct person using two identifiers.  Location: Patient: Home Provider: Home Office   I discussed the limitations of evaluation and management by telemedicine and the availability of in person appointments. The patient expressed understanding and agreed to proceed.   The patient was advised to call back or seek an in-person evaluation if the symptoms worsen or if the condition fails to improve as anticipated.  I provided 53 minutes of non-face-to-face time during this encounter.  Participation Level: Active  Behavioral Response: CasualAlertDepressed and Euthymic  Type of Therapy: Individual Therapy  Treatment Goals addressed:   Progressing (6) LTG: Reduce frequency, intensity, and duration of depression symptoms so that daily functioning is improved (OP Depression) LTG: Increase coping skills to manage depression and improve ability to perform daily activities (OP Depression) STG: Riley Smith will identify cognitive patterns and beliefs that support depression (OP Depression) LTG: I want to see improvements in my moods AEB doing more things I enjoy and feeling joy out of life (OP Depression) STG: Report a decrease in anxiety symptoms as evidenced by an overall reduction in anxiety score by a minimum of 25% on the Generalized Anxiety Disorder Scale (GAD-7) (Anxiety) LTG: I'd like to worry less about just everything (Anxiety)  Initial (4) LTG: Recall traumatic events without becoming overwhelmed with negative emotions (BH CCP Acute or Chronic Trauma Reaction) STG: Riley Smith will describe the signs and symptoms of PTSD that are experienced and how they interfere with daily living [facility] CCP Acute or Chronic Trauma Reaction) STG: Riley Smith will  identify internal and external stimuli that trigger PTSD symptoms (BH CCP Acute or Chronic Trauma Reaction) STG: Riley Smith will identify coping strategies to deal with trauma memories and the associated emotional reaction (BH CCP Acute or Chronic Trauma Reaction)  ProgressTowards Goals: Progressing  Interventions: CBT, Solution Focused, and Supportive  Summary: Riley Smith is a 23 y.o. male with past psych history of MDD, GAD, and Personality D/O in Adult, presenting for follow-up in efforts to improve management of depressive and anxious symptoms.   Pt actively engaged in session, presenting in primarily pleasant moods throughout duration of visit. Pt engaged in introductory check-in, stating It's going okay, detailing of being tired this morning due to broken sleep, reporting of having one bad dream but uncertain as to factors why woke at other times. Pt further detailed of having house-sat for mother last week, starting donating plasma again for money, and no leads on job yet, sharing of not much else going on for pt. Reassessed depressive and anxious sxs via PHQ-9 and GAD-7, noting of reduction in both depressive and anxious sxs, finding moods improving, able to do things he enjoys, further detailing abilities to reflect on events, accept them, and move forward. Reflected on individual efforts at increasing time outside of the home, having spent time in community. ***        of feeling tired this morning, having stayed up later, other than that things have been pretty okay, taking things easy, pretty much just doing daily chores, applying for jobs, had a few interviews, one being a scam which pt ended call promptly, and others proving to be unfruitful. Further explored pt's interest in finding employment after having take the last few weeks to reflect on events  and process challenges of the past few years surrounding loss of family members, finding these not to affect pt as much as previously.  Processed individual perceived benefits of now re-entering workforce, giving self something to do to get out of house and make some money,  Patient responded well to interventions. Patient continues to meet criteria for MDD, GAD, and Personality D/O in Adult. Patient will continue to benefit from engagement in outpatient therapy due to being the least restrictive service to meet presenting needs.      03/22/2024   10:18 AM 02/16/2024   10:45 AM 01/01/2024    1:28 PM 12/19/2023    1:12 PM 11/21/2023    1:09 PM  Depression screen PHQ 2/9  Decreased Interest 1 1 0 1 1  Down, Depressed, Hopeless 1 1 1 2 1   PHQ - 2 Score 2 2 1 3 2   Altered sleeping 2 3 1 2 2   Tired, decreased energy 2 3 1 2 1   Change in appetite 0 0 0 0 0  Feeling bad or failure about yourself  1 1 1 1 1   Trouble concentrating 0 1 0 0 0  Moving slowly or fidgety/restless 0 0 0 0 0  Suicidal thoughts 1 1 0 0 1  PHQ-9 Score 8 11 4 8 7   Difficult doing work/chores Somewhat difficult Somewhat difficult Somewhat difficult Somewhat difficult Somewhat difficult      03/22/2024   10:15 AM 02/16/2024   10:40 AM 01/01/2024    1:26 PM 12/19/2023    1:10 PM  GAD 7 : Generalized Anxiety Score  Nervous, Anxious, on Edge 1 2 1 1   Control/stop worrying 1 2 1 1   Worry too much - different things 1 1 1 1   Trouble relaxing 0 0 0 0  Restless 0 0 0 0  Easily annoyed or irritable 1 1 1  0  Afraid - awful might happen 1 1 1 1   Total GAD 7 Score 5 7 5 4   Anxiety Difficulty Somewhat difficult Somewhat difficult Somewhat difficult Somewhat difficult    Suicidal/Homicidal: Nowithout intent/plan. pSI, of things being easier if wasn't here, denying desires or intent to harm self.  Therapist Response: Clinician utilized CBT, MI, and solution focused interventions to support pt in navigating presenting problems and sxs.   Clinician ***   openly greeted pt upon joining today's virtual visit, assessing pt's presenting moods and affect, engaging pt in  introductory check-in, prompting pt's recounts of events and factors contributing to presenting moods, actively listening to pt's reflections of challenges over the past week. Utilizing open ended questions to support pt in exploring thoughts, feelings, and perspectives relating to presenting stressors, surrounding employment and pt's interests related to work and explored job opportunities. Further aided pt in processing thoughts and feelings surrounding home stressors and desires to secure work as a means of giving self opportunities to leave the house daily as well as earn income.  Clinician reassessed severity of presenting sxs, and presence of any safety concerns. Therapist provided support and empathy to patient during session.  Homework: Explore interest in securing local employment to increase time outside of the home and socialization.  Plan: Return again in 2-3 weeks.  Diagnosis:  Encounter Diagnoses  Name Primary?   Moderate episode of recurrent major depressive disorder (HCC) Yes   Generalized anxiety disorder    Personality disorder in adult [facility])       Collaboration of Care: Psychiatrist AEB provider notes available in EHR.  Patient/Guardian was advised  Release of Information must be obtained prior to any record release in order to collaborate their care with an outside provider. Patient/Guardian was advised if they have not already done so to contact the registration department to sign all necessary forms in order for us  to release information regarding their care.   Consent: Patient/Guardian gives verbal consent for treatment and assignment of benefits for services provided during this visit. Patient/Guardian expressed understanding and agreed to proceed.   Riley Smith, MSW, LCSW 03/22/2024,  10:18 AM

## 2024-04-05 ENCOUNTER — Ambulatory Visit (HOSPITAL_COMMUNITY): Payer: MEDICAID | Admitting: Licensed Clinical Social Worker

## 2024-04-05 DIAGNOSIS — F609 Personality disorder, unspecified: Secondary | ICD-10-CM

## 2024-04-05 DIAGNOSIS — F411 Generalized anxiety disorder: Secondary | ICD-10-CM

## 2024-04-05 DIAGNOSIS — F331 Major depressive disorder, recurrent, moderate: Secondary | ICD-10-CM

## 2024-04-05 NOTE — Progress Notes (Signed)
 THERAPIST PROGRESS NOTE   Session Date: 04/05/2024  Session Time: 1012 - 1055 Virtual Visit via Video Note  I connected with Riley Smith on 04/05/24 at 10:00 AM EDT by a video enabled telemedicine application and verified that I am speaking with the correct person using two identifiers.  Location: Patient: Home Provider: Home Office   I discussed the limitations of evaluation and management by telemedicine and the availability of in person appointments. The patient expressed understanding and agreed to proceed.   The patient was advised to call back or seek an in-person evaluation if the symptoms worsen or if the condition fails to improve as anticipated.  I provided 43 minutes of non-face-to-face time during this encounter.  Participation Level: Active  Behavioral Response: CasualAlertEuthymic  Type of Therapy: Individual Therapy  Treatment Goals addressed:   Progressing (6) LTG: Reduce frequency, intensity, and duration of depression symptoms so that daily functioning is improved (OP Depression) LTG: Increase coping skills to manage depression and improve ability to perform daily activities (OP Depression) STG: Dushawn will identify cognitive patterns and beliefs that support depression (OP Depression) LTG: I want to see improvements in my moods AEB doing more things I enjoy and feeling joy out of life (OP Depression) STG: Report a decrease in anxiety symptoms as evidenced by an overall reduction in anxiety score by a minimum of 25% on the Generalized Anxiety Disorder Scale (GAD-7) (Anxiety) LTG: I'd like to worry less about just everything (Anxiety)  Initial (4) LTG: Recall traumatic events without becoming overwhelmed with negative emotions (BH CCP Acute or Chronic Trauma Reaction) STG: Brendan will describe the signs and symptoms of PTSD that are experienced and how they interfere with daily living [facility] CCP Acute or Chronic Trauma Reaction) STG: Suhail will identify  internal and external stimuli that trigger PTSD symptoms (BH CCP Acute or Chronic Trauma Reaction) STG: Rayshawn will identify coping strategies to deal with trauma memories and the associated emotional reaction (BH CCP Acute or Chronic Trauma Reaction)  ProgressTowards Goals: Progressing  Interventions: CBT, Solution Focused, and Supportive  Summary: Riley Smith is a 23 y.o. male with past psych history of MDD, GAD, and Personality D/O in Adult, presenting for follow-up in efforts to improve management of depressive and anxious symptoms.   Pt actively engaged in session, presenting in pleasant moods throughout duration of visit. Pt engaged in introductory check-in, stating It's been up and down, up and down, detailing of having woken up late and almost missed appt. Pt further shared of still having yet to secure work, feeling tired over the past few days, detailing of cousins have been over visiting, reflecting on recent events of having ran errands and cousins coming over one day and not leaving until 11p-12a, and another night when cousins didn't leave until 1a. Pt shared As for the good news.. detailing of having been donating plasma again, proving to have helped with some money as well as having started applying for other jobs again, noting to be in improved moods with having some money. Actively engaged in reassessing of presenting depressive and anxious sxs via PHQ-9 and GAD-7, further processing maintained reduction of mild anxious sxs, and reduction of depressive sxs, noting of overall improvements in sleep and having prioritized rest over the past week.  Further engaged in exploration of improvements in moods, processing maintained improvements in moods over recent weeks, attributing to being able to learn some money, and individual increased efforts at being proactive in seeking employment. Actively reflected on pt's individual  efforts at proving to maintain improved abilities at managing sxs and  improving moods.   Patient responded well to interventions. Patient continues to meet criteria for MDD, GAD, and Personality D/O in Adult. Patient will continue to benefit from engagement in outpatient therapy due to being the least restrictive service to meet presenting needs.      04/05/2024   10:25 AM 03/22/2024   10:18 AM 02/16/2024   10:45 AM 01/01/2024    1:28 PM 12/19/2023    1:12 PM  Depression screen PHQ 2/9  Decreased Interest 1 1 1  0 1  Down, Depressed, Hopeless 1 1 1 1 2   PHQ - 2 Score 2 2 2 1 3   Altered sleeping 1 2 3 1 2   Tired, decreased energy 1 2 3 1 2   Change in appetite 0 0 0 0 0  Feeling bad or failure about yourself  1 1 1 1 1   Trouble concentrating 0 0 1 0 0  Moving slowly or fidgety/restless 0 0 0 0 0  Suicidal thoughts 0 1 1 0 0  PHQ-9 Score 5 8 11 4 8   Difficult doing work/chores Somewhat difficult Somewhat difficult Somewhat difficult Somewhat difficult Somewhat difficult      04/05/2024   10:22 AM 03/22/2024   10:15 AM 02/16/2024   10:40 AM 01/01/2024    1:26 PM  GAD 7 : Generalized Anxiety Score  Nervous, Anxious, on Edge 1 1 2 1   Control/stop worrying 0 1 2 1   Worry too much - different things 1 1 1 1   Trouble relaxing 1 0 0 0  Restless 0 0 0 0  Easily annoyed or irritable 1 1 1 1   Afraid - awful might happen 1 1 1 1   Total GAD 7 Score 5 5 7 5   Anxiety Difficulty Somewhat difficult Somewhat difficult Somewhat difficult Somewhat difficult    Suicidal/Homicidal: Nowithout intent/plan. pSI, of things being easier if wasn't here, denying desires or intent to harm self.  Therapist Response: Clinician utilized CBT, MI, and solution focused interventions to support pt in navigating presenting problems and sxs.   Clinician actively greeted patient upon joining today's virtual visit, assessing presenting moods and affect, and engaging patient in introductory check-in. Openly engaged pt in check-in, exploring recent events and factors contributing to presenting  moods. Actively listened to patient's reflections of events of the past two weeks, providing support and validation for pt's expressed thoughts and feelings, and pt's increased efforts of behavioral activation steps to support in improving moods by securing employment. Reassessed presenting depressives and anxious sxs via PHQ-9 and GAD-7, further supporting pt in processing factors contributing to maintained reduction in anxious sxs, and recent reduction in depressive sxs.  Clinician reassessed severity of presenting sxs, and presence of any safety concerns. Therapist provided support and empathy to patient during session.  Homework: None.  Plan: Return again in 2-3 weeks.  Diagnosis:  Encounter Diagnoses  Name Primary?   Moderate episode of recurrent major depressive disorder (HCC) Yes   Generalized anxiety disorder    Personality disorder in adult [facility])     Collaboration of Care: Psychiatrist AEB provider notes available in EHR.  Patient/Guardian was advised Release of Information must be obtained prior to any record release in order to collaborate their care with an outside provider. Patient/Guardian was advised if they have not already done so to contact the registration department to sign all necessary forms in order for us  to release information regarding their care.   Consent: Patient/Guardian  gives verbal consent for treatment and assignment of benefits for services provided during this visit. Patient/Guardian expressed understanding and agreed to proceed.   Lynwood JONETTA Maris, MSW, LCSW 04/05/2024,  10:28 AM

## 2024-04-29 ENCOUNTER — Ambulatory Visit (INDEPENDENT_AMBULATORY_CARE_PROVIDER_SITE_OTHER): Payer: MEDICAID | Admitting: Licensed Clinical Social Worker

## 2024-04-29 ENCOUNTER — Encounter (HOSPITAL_COMMUNITY): Payer: Self-pay

## 2024-04-29 DIAGNOSIS — Z91199 Patient's noncompliance with other medical treatment and regimen due to unspecified reason: Secondary | ICD-10-CM

## 2024-04-29 NOTE — Progress Notes (Signed)
 THERAPIST PROGRESS NOTE   Session Date: 04/29/2024  Session Time: 1100  Pt contacted office at approx. 1030 to relay of inabilities to attend today's scheduled virtual visit. Pt's next scheduled appt is 05/14/24. All future appt's after 05/14/24 have been canceled due to pt's inconsistency in tx. Clinician will engage pt in exploring attendance concerns and reiterate grounds for discharge.    Lynwood JONETTA Maris, MSW, LCSW 04/29/2024,  10:56 AM

## 2024-05-14 ENCOUNTER — Encounter (HOSPITAL_COMMUNITY): Payer: Self-pay | Admitting: Licensed Clinical Social Worker

## 2024-05-14 ENCOUNTER — Encounter (HOSPITAL_COMMUNITY): Payer: Self-pay

## 2024-05-14 ENCOUNTER — Ambulatory Visit (INDEPENDENT_AMBULATORY_CARE_PROVIDER_SITE_OTHER): Payer: MEDICAID | Admitting: Licensed Clinical Social Worker

## 2024-05-14 ENCOUNTER — Telehealth (HOSPITAL_COMMUNITY): Payer: Self-pay | Admitting: Licensed Clinical Social Worker

## 2024-05-14 DIAGNOSIS — Z91199 Patient's noncompliance with other medical treatment and regimen due to unspecified reason: Secondary | ICD-10-CM

## 2024-05-14 NOTE — Telephone Encounter (Signed)
 05/14/24 Discharge letter sent certified mail [phone].[email or web address]

## 2024-05-14 NOTE — Progress Notes (Signed)
 THERAPIST PROGRESS NOTE   Session Date: 05/14/2024  Session Time: 1000  Riley Smith    Clinician attempted to connect with patient for scheduled appointment via Caregility video, sending text request x4 with no response.     Attempt 1: Text: 1005  Attempt 2: Text: 1007   Attempt 3: Text: 1008  Attempt 4: Text: 1009   Disconnected video visit at:  1012     Per Riley Smith policy, after multiple attempts to reach patient unsuccessfully at appointed time, visit will be coded as a no show.   Due to today's missed appt being pt's 6th No-Show, pt will be discharged from practice due to lack of engagement in tx. Office will provide formal written notice to pt via mail.  Riley Smith, MSW, LCSW 05/14/2024,  10:19 AM

## 2024-05-27 ENCOUNTER — Ambulatory Visit (HOSPITAL_COMMUNITY): Payer: MEDICAID | Admitting: Licensed Clinical Social Worker

## 2024-05-27 ENCOUNTER — Ambulatory Visit (HOSPITAL_COMMUNITY): Payer: MEDICAID | Admitting: Psychiatry

## 2024-05-27 NOTE — Progress Notes (Signed)
 Psychiatric Follow Up Adult Assessment  Date: 06/03/2024, 9:08 AM  Patient Identification: Riley Smith MRN: [id number] DOB: 2001-04-29   Assessment Patient seen in person today. He is doing well regarding his symptoms of depression and anxiety. He unfortunately has missed multiple therapy appointments and attributes this to psychosocial stressors but states that he is planning to re-establish to another therapist in the [facility] walk in hours. I discussed the importance of making it to his clinic appointments and he is motivated in doing so. Discussed his hypersomnia and encouraged him to set an alarm to leave his bedroom regardless of his energy levels. Patient has a strong support system and feels that his medications have been helpful in keeping his symptoms stable. While he previously would having fleeting thoughts of passive SI, he is able to voice motivating factors as to why he has not acted on the thoughts. We discussed a safety plan including warning signs, coping strategies, and possible people to contact and resources to utilize if SI worsen. No medication changes today, f/u in 2 months.   Plan  Major Depressive Disorder, recurrent episode, moderate -Continue Lexapro  10 mg daily -Continue hydroxyzine  10 mg tid prn  Generalized Anxiety Disorder -lexapro  as above -hydoxyzine as above  Patient was given contact information for behavioral health clinic and was instructed to call 911 for emergencies.   Identifying Information: Riley Smith is a 23 y.o. male with PMH of MDD, GAD, and unspecified personality disorder who presents for medication management. Patient had been hospitalized 11/18/22 for SI due to psychosocial stressors and once in 06/2020.    Interval history: Patient seen alone.  Patient reports feeling pretty alright today. 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 Started dating online for the past month. Stressors include grandmother's  health worsening and helps care for her.   Regarding psychiatric symptoms, he notes his depression and anxiety are fairly better compared to previous visit. He reports coping with his worries through mindfulness and staying organized for his future. Patient reports the following adverse effects: denies. He states in his typical day, he wakes up at around 11-1 PM and he feed and water his pet pig, eat breakfast, care for his grandmother, playing video games, and at times donating plasma and spending time with his mother and sister.   Patient reports varied sleep, reporting about 8-12 hours nightly. He plans on setting an alarm and regardless of energy, he plans on getting up from bed. Patient reports good appetite, reporting eating 2-3 meals daily.   Patient denies current active SI, HI, and AVH. He reports having fleeting thoughts of passive SI in the past and he states he does not act on the thoughts because of his brothers, sisters, and the hope that his future can get better.   PAST HISTORY   Past Psychiatric History:  Psychiatrist: Previously saw Dr. Prentice Espy Therapist: Lynwood Maris- multiple no shows Prior medications: Abilify  (non compliant and ran out) Hospitalizations: twice for SI (in 2021 and 11/2022) Suicide attempts: denies Psychotherapy: endorses  Past Medical History: None  Allergies: Patient has no known allergies.   Social History:  Living with: father, aunt, grandmother, grandfather Income: currently unemployed, working to get another job in Catering manager  Substance Use History: EtOH:  reports current alcohol use. Reports monthly- 1-2 shots at a time Nicotine:  reports that he has never smoked. He has never used smokeless tobacco. Marijuana rarely- once every 3 months  Substance Abuse History in  the last 12 months:  No.    Past Medical History:  Past Medical History:  Diagnosis Date   Anxiety    Depression    Suicidal behavior     Past Surgical History:  Procedure  Laterality Date   No prior surgery      Family History:  Family History  Problem Relation Age of Onset   Healthy Mother    Depression Mother    Healthy Father     Social History:   Social History   Socioeconomic History   Marital status: Single    Spouse name: Not on file   Number of children: 0   Years of education: Not on file   Highest education level: Some college, no degree  Occupational History   Not on file  Tobacco Use   Smoking status: Never   Smokeless tobacco: Never  Vaping Use   Vaping status: Never Used  Substance and Sexual Activity   Alcohol use: Yes    Comment: occ   Drug use: Never   Sexual activity: Not Currently  Other Topics Concern   Not on file  Social History Narrative   Not on file   Social Drivers of Health   Financial Resource Strain: Low Risk  (01/20/2023)   Overall Financial Resource Strain (CARDIA)    Difficulty of Paying Living Expenses: Not very hard  Recent Concern: Financial Resource Strain - Medium Risk (12/16/2022)   Overall Financial Resource Strain (CARDIA)    Difficulty of Paying Living Expenses: Somewhat hard  Food Insecurity: Food Insecurity Present (01/20/2023)   Hunger Vital Sign    Worried About Running Out of Food in the Last Year: Sometimes true    Ran Out of Food in the Last Year: Never true  Transportation Needs: Unmet Transportation Needs (01/20/2023)   PRAPARE - Transportation    Lack of Transportation (Medical): Yes    Lack of Transportation (Non-Medical): Yes  Physical Activity: Insufficiently Active (01/20/2023)   Exercise Vital Sign    Days of Exercise per Week: 1 day    Minutes of Exercise per Session: 20 min  Stress: Stress Concern Present (01/20/2023)   [employer] of Occupational Health - Occupational Stress Questionnaire    Feeling of Stress : Very much  Social Connections: Socially Isolated (01/20/2023)   Social Connection and Isolation Panel    Frequency of Communication with Friends and Family:  Once a week    Frequency of Social Gatherings with Friends and Family: Never    Attends Religious Services: Never    Database administrator or Organizations: No    Attends Engineer, structural: Not on file    Marital Status: Never married    Allergies: No Known Allergies  Current Medications: Current Outpatient Medications  Medication Sig Dispense Refill   cyclobenzaprine  (FLEXERIL ) 10 MG tablet Take 1 tablet (10 mg total) by mouth 2 (two) times daily as needed for muscle spasms. 20 tablet 0   escitalopram  (LEXAPRO ) 10 MG tablet Take 1 tablet (10 mg total) by mouth daily. 90 tablet 0   hydrOXYzine  (ATARAX ) 10 MG tablet Take 1 tablet (10 mg total) by mouth 3 (three) times daily as needed. 180 tablet 0   loratadine  (CLARITIN ) 10 MG tablet Take 1 tablet (10 mg total) by mouth daily. 30 tablet 11   Multiple Vitamins-Iron (DAILY MULTIVITAMINS/IRON PO) Take 1 tablet by mouth daily.     ondansetron  (ZOFRAN -ODT) 4 MG disintegrating tablet Take 1 tablet (4 mg total) by mouth every  6 (six) hours as needed for nausea or vomiting. 20 tablet 0   No current facility-administered medications for this visit.      Objective  Psychiatric Specialty Exam: General Appearance: appears at stated age, casually dressed and groomed   Behavior: pleasant and cooperative   Psychomotor Activity: no psychomotor agitation or retardation noted   Eye Contact: fair  Speech: normal amount, volume and fluency    Mood: euthymic  Affect: congruent, pleasant and interactive   Thought Process: linear, goal directed, no circumstantial or tangential thought process noted, no racing thoughts or flight of ideas  Descriptions of Associations: intact   Thought Content Hallucinations: denies AH, VH , does not appear responding to stimuli  Delusions: no paranoia, delusions of control, grandeur, ideas of reference, thought broadcasting, and magical thinking  Suicidal Thoughts: denies SI, intention, plan   Homicidal Thoughts: denies HI, intention, plan   Alertness/Orientation: alert and fully oriented   Insight: fair Judgment: fair  Memory: intact   Executive Functions  Concentration: intact  Attention Span: fair  Recall: intact  Fund of Knowledge: fair   Physical Exam  General: Pleasant, well-appearing . No acute distress. Pulmonary: Normal effort. No wheezing or rales. Skin: No obvious rash or lesions. Neuro: A&Ox3.No focal deficit.  Review of Systems  No reported symptoms   Screenings:  AIMS    Flowsheet Row [facility]  AIMS Total Score 0   AUDIT    Flowsheet Row Ad[facility] (Dis[facility]  Alcohol Use Disorder Identification Test Final Score (AUDIT) 2   GAD-7    Flowsheet Row Counselor from 04/05/2024 in [facility] from 03/22/2024 in [facility] at [facility] from 02[facility]16/2024 in [facility] at [facility] from 01[facility]01/2024 in [facility] at [facility] from 12/19/2023 in [facility] at [facility]  Total GAD-7 Score 5 5 7 5 4    PHQ2-9    Flowsheet Row Counselor from 04/05/2024 in [facility] at [facility] from 03/22/2024 in [facility] at [facility] from 02/16/2024 in [facility] at [facility] from 01/01/2024 in [facility] Outpatient [facility] at [place] Counselor from 12/19/2023 in [facility] at [facility] Total Score 2 2 2 1 3   PHQ-9 Total Score 5 8 11 4 8    Flowsheet Row Counselor from 02/16/2024 in [facility] at [facility] from 01/08/2024 in [facility] at [facility] from 11/08/2023 in [facility] at [facility] RISK CATEGORY Low Risk No Risk Moderate Risk    Collaboration of Care: Case discussed with current outpatient attending, see attending's attestation for additional information   Signed: Ismael KATHEE Franco, MD

## 2024-06-03 ENCOUNTER — Ambulatory Visit (HOSPITAL_BASED_OUTPATIENT_CLINIC_OR_DEPARTMENT_OTHER): Payer: MEDICAID | Admitting: Psychiatry

## 2024-06-03 ENCOUNTER — Other Ambulatory Visit: Payer: Self-pay

## 2024-06-03 DIAGNOSIS — F411 Generalized anxiety disorder: Secondary | ICD-10-CM

## 2024-06-03 DIAGNOSIS — F331 Major depressive disorder, recurrent, moderate: Secondary | ICD-10-CM | POA: Diagnosis not present

## 2024-06-03 MED ORDER — ESCITALOPRAM OXALATE 10 MG PO TABS
10.0000 mg | ORAL_TABLET | Freq: Every day | ORAL | 2 refills | Status: DC
  Filled 2024-06-03 – 2024-06-24 (×2): qty 30, 30d supply, fill #0
  Filled 2024-07-26: qty 30, 30d supply, fill #1
  Filled 2024-09-02: qty 30, 30d supply, fill #2

## 2024-06-03 MED ORDER — HYDROXYZINE HCL 10 MG PO TABS
10.0000 mg | ORAL_TABLET | Freq: Three times a day (TID) | ORAL | 0 refills | Status: DC | PRN
  Filled 2024-06-03 – 2024-07-26 (×3): qty 30, 10d supply, fill #0

## 2024-06-03 NOTE — Patient Instructions (Signed)
° °  Please come to [facility] (this facility) during walk in hours for appointment with psychiatrist for further medication management and for therapists for therapy.  ° ° Walk in hours are 8-11 AM Monday through Thursday for medication management.Child and adolescent psychiatrists are only available on Wednesdays and Thursdays during walk in hours.  °Therapy walk in hours are Monday-Wednesday 8 AM-1PM.   It is first come, first -serve; it is best to arrive by 7:00 AM.  ° °On Friday from 1 pm to 4 pm for therapy intake only. Please arrive by 12:00 pm as it is  first come, first -serve.   ° °When you arrive please go upstairs for your appointment. If you are unsure of where to go, inform the front desk that you are here for a walk in appointment and they will assist you with directions upstairs. ° °Address:  °[address], in [place], [address] °Ph: [phone]  ° °

## 2024-06-03 NOTE — Addendum Note (Signed)
 Addended by: CARVIN CROCK on: 06/03/2024 03:48 PM   Modules accepted: Level of Service

## 2024-06-10 ENCOUNTER — Other Ambulatory Visit: Payer: Self-pay

## 2024-06-10 ENCOUNTER — Ambulatory Visit (HOSPITAL_COMMUNITY): Payer: MEDICAID | Admitting: Licensed Clinical Social Worker

## 2024-06-12 ENCOUNTER — Other Ambulatory Visit: Payer: Self-pay

## 2024-06-24 ENCOUNTER — Ambulatory Visit (HOSPITAL_COMMUNITY): Payer: MEDICAID | Admitting: Licensed Clinical Social Worker

## 2024-06-24 ENCOUNTER — Other Ambulatory Visit: Payer: Self-pay

## 2024-06-25 ENCOUNTER — Other Ambulatory Visit: Payer: Self-pay

## 2024-07-08 ENCOUNTER — Ambulatory Visit (HOSPITAL_COMMUNITY): Payer: MEDICAID | Admitting: Licensed Clinical Social Worker

## 2024-07-22 ENCOUNTER — Ambulatory Visit (HOSPITAL_COMMUNITY): Payer: MEDICAID | Admitting: Licensed Clinical Social Worker

## 2024-07-26 ENCOUNTER — Other Ambulatory Visit: Payer: Self-pay

## 2024-07-30 ENCOUNTER — Other Ambulatory Visit: Payer: Self-pay

## 2024-08-04 NOTE — Progress Notes (Signed)
 This encounter was created in error - please disregard.

## 2024-08-07 ENCOUNTER — Encounter (HOSPITAL_COMMUNITY): Payer: MEDICAID | Admitting: Psychiatry

## 2024-08-17 ENCOUNTER — Ambulatory Visit (HOSPITAL_COMMUNITY)
Admission: EM | Admit: 2024-08-17 | Discharge: 2024-08-17 | Disposition: A | Discharge status: Home / Self Care | Payer: MEDICAID | Attending: Emergency Medicine | Admitting: Emergency Medicine

## 2024-08-17 DIAGNOSIS — M25522 Pain in left elbow: Secondary | ICD-10-CM

## 2024-08-17 DIAGNOSIS — M25511 Pain in right shoulder: Secondary | ICD-10-CM

## 2024-08-17 MED ORDER — METHOCARBAMOL 500 MG PO TABS
500.0000 mg | ORAL_TABLET | Freq: Two times a day (BID) | ORAL | 0 refills | Status: AC
  Filled 2024-08-17: qty 20, 10d supply, fill #0

## 2024-08-17 MED ORDER — IBUPROFEN 800 MG PO TABS
800.0000 mg | ORAL_TABLET | Freq: Three times a day (TID) | ORAL | 0 refills | Status: AC
  Filled 2024-08-17: qty 30, 10d supply, fill #0

## 2024-08-17 NOTE — Discharge Instructions (Addendum)
 Take 800 mg of ibuprofen  every 8 hours to help with pain and inflammation Heat and gentle stretching can help loosen up the stiff muscles in addition to the muscle relaxer, this may cause drowsiness If symptoms persist into next week without any improvement follow-up with sports medicine for further evaluation  Return to clinic for any new or urgent symptoms
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
## 2024-08-19 ENCOUNTER — Other Ambulatory Visit: Payer: Self-pay

## 2024-08-28 ENCOUNTER — Other Ambulatory Visit: Payer: Self-pay

## 2024-09-03 ENCOUNTER — Other Ambulatory Visit: Payer: Self-pay

## 2024-09-04 ENCOUNTER — Other Ambulatory Visit: Payer: Self-pay

## 2024-09-16 NOTE — Progress Notes (Cosign Needed Addendum)
 Psychiatric Follow Up Adult Assessment Televisit via video: I connected with Riley Smith on 1/12 at  2:00 PM EST by a video enabled telemedicine application and verified that I am speaking with the correct person using two identifiers.  Location: Patient: home Provider: office   I discussed the limitations of evaluation and management by telemedicine and the availability of in person appointments. The patient expressed understanding and agreed to proceed.  I discussed the assessment and treatment plan with the patient. The patient was provided an opportunity to ask questions and all were answered. The patient agreed with the plan and demonstrated an understanding of the instructions.   The patient was advised to call back or seek an in-person evaluation if the symptoms worsen or if the condition fails to improve as anticipated.   Date: 09/23/2024, 1:55 PM  Patient Identification: Riley Smith MRN: [id number] DOB: 2001-05-30   Assessment Patient presents for a follow-up appointment. In the prior appointment, the psychotropic medication regimen was maintained.   Today, patient presents with worsened depression, anhedonia, and feeling worthless and anxiety in the context of psychosocial stressors. He has limited coping strategies and unfortunately has been dismissed from therapy due to multiple no shows. I discussed coming during walk in hours for therapy at the [facility]. I discussed increasing Lexapro  to 15 mg to aid with the depression and anxiety. Patient has fleeting thoughts of SI and prevents himself form acting on the thought due to future oriented goals. We discussed a safety plan including warning signs, coping strategies, and possible people to contact and resources to utilize if SI worsen. Reassuringly, he has not increased his substance use consumption amidst the worsening symptoms F/u in 1.5 months.    Plan  Major Depressive Disorder, recurrent episode,  moderate -Increase Lexapro  to 15 mg daily -Continue hydroxyzine  10 mg tid prn -Therapy resource sent  Generalized Anxiety Disorder - Lexapro  as above - Hydoxyzine as above  Patient was given contact information for behavioral health clinic and was instructed to call 911 for emergencies.   Identifying Information: Riley Smith is a 24 y.o. male with PMH of MDD, GAD, and unspecified personality disorder who presents for medication management. Patient had been hospitalized 11/18/22 for SI due to psychosocial stressors and once in 06/2020.    Interval history: Patient seen alone.  Patient reports feeling pretty okay today. Since the previous visit, he notes having some more downs. He states having stress about money and his job and his girlfriend. He states asking his girlfriend to hang out and she would deny him. He reports minimally coping with his stress. He states listening to music and coloring. He takes his hydroxyzine  at night to help ease his anxiety which he is noticing more of this. He notes some lack of pleasure with his games.   Patient reports good sleep, having some oversleeping and still feel tired for some period. He reports sleeping 9-10 hours of sleep. Patient reports good appetite, reporting eating 2-3 meals.   Patient denies current SI, HI, and AVH. He states having some thoughts maybe I am better off dead and states after thinking about it, the thought goes away. He stops himself by making future plans and feels he can get better. He states feeling like a failure with his current job situation. He plans at looking for a new job.  Substance use:  Tobacco: denies Alcohol: denies, reports most recently one shot 2 months ago Illicit substances: denies   PAST HISTORY  Past Psychiatric History:  Psychiatrist: Previously saw Dr. Prentice Espy Therapist: Lynwood Maris- multiple no shows Prior medications: Abilify  (non compliant and ran out) Hospitalizations: twice for SI (in  2021 and 11/2022) Suicide attempts: denies  Past Medical History: None  Allergies: Patient has no known allergies.   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 Reports monthly- 1-2 shots at a time Nicotine:  reports that he has never smoked. He has never used smokeless tobacco. Marijuana rarely- once every 3 months  Substance Abuse History in the last 12 months:  No.    Past Medical History:  Past Medical History:  Diagnosis Date   Anxiety    Depression    Suicidal behavior     Past Surgical History:  Procedure Laterality Date   No prior surgery      Family History:  Family History  Problem Relation Age of Onset   Healthy Mother    Depression Mother    Healthy Father     Social History:   Social History   Socioeconomic History   Marital status: Single    Spouse name: Not on file   Number of children: 0   Years of education: Not on file   Highest education level: Some college, no degree  Occupational History   Not on file  Tobacco Use   Smoking status: Never   Smokeless tobacco: Never  Vaping Use   Vaping status: Never Used  Substance and Sexual Activity   Alcohol use: Yes    Comment: occ   Drug use: Never   Sexual activity: Not Currently  Other Topics Concern   Not on file  Social History Narrative   Not on file   Social Drivers of Health   Tobacco Use: Low Risk (01/15/2024)   Patient History    Smoking Tobacco Use: Never    Smokeless Tobacco Use: Never    Passive Exposure: Not on file  Financial Resource Strain: Low Risk (01/20/2023)   Overall Financial Resource Strain (CARDIA)    Difficulty of Paying Living Expenses: Not very hard  Recent Concern: Financial Resource Strain - Medium Risk (12/16/2022)   Overall Financial Resource Strain (CARDIA)    Difficulty of Paying Living Expenses: Somewhat hard   Food Insecurity: Food Insecurity Present (01/20/2023)   Hunger Vital Sign    Worried About Running Out of Food in the Last Year: Sometimes true    Ran Out of Food in the Last Year: Never true  Transportation Needs: Unmet Transportation Needs (01/20/2023)   PRAPARE - Transportation    Lack of Transportation (Medical): Yes    Lack of Transportation (Non-Medical): Yes  Physical Activity: Insufficiently Active (01/20/2023)   Exercise Vital Sign    Days of Exercise per Week: 1 day    Minutes of Exercise per Session: 20 min  Stress: Stress Concern Present (01/20/2023)   [employer] of Occupational Health - Occupational Stress Questionnaire    Feeling of Stress : Very much  Social Connections: Socially Isolated (01/20/2023)   Social Connection and Isolation Panel    Frequency of Communication with Friends and Family: Once a week    Frequency of Social Gatherings with Friends and Family: Never    Attends Religious Services: Never    Database Administrator or Organizations: No    Attends Engineer, Structural: Not on file    Marital Status:  Never married  Depression (PHQ2-9): Medium Risk (04/05/2024)   Depression (PHQ2-9)    PHQ-2 Score: 5  Alcohol Screen: Low Risk (01/20/2023)   Alcohol Screen    Last Alcohol Screening Score (AUDIT): 1  Housing: Medium Risk (01/20/2023)   Housing    Last Housing Risk Score: 1  Utilities: Not At Risk (08/15/2022)   [facility] Utilities    Threatened with loss of utilities: No  Health Literacy: Not on file    Allergies: No Known Allergies  Current Medications: Current Outpatient Medications  Medication Sig Dispense Refill   cyclobenzaprine  (FLEXERIL ) 10 MG tablet Take 1 tablet (10 mg total) by mouth 2 (two) times daily as needed for muscle spasms. 20 tablet 0   escitalopram  (LEXAPRO ) 10 MG tablet Take 1 tablet (10 mg total) by mouth daily. 30 tablet 2   hydrOXYzine  (ATARAX ) 10 MG tablet Take 1 tablet (10 mg total) by mouth 3 (three) times daily as  needed. 30 tablet 0   ibuprofen  (ADVIL ) 800 MG tablet Take 1 tablet (800 mg total) by mouth 3 (three) times daily. 30 tablet 0   loratadine  (CLARITIN ) 10 MG tablet Take 1 tablet (10 mg total) by mouth daily. 30 tablet 11   methocarbamol  (ROBAXIN ) 500 MG tablet Take 1 tablet (500 mg total) by mouth 2 (two) times daily. 20 tablet 0   Multiple Vitamins-Iron (DAILY MULTIVITAMINS/IRON PO) Take 1 tablet by mouth daily.     ondansetron  (ZOFRAN -ODT) 4 MG disintegrating tablet Take 1 tablet (4 mg total) by mouth every 6 (six) hours as needed for nausea or vomiting. 20 tablet 0   No current facility-administered medications for this visit.      Objective  Psychiatric Specialty Exam: General Appearance: appears at stated age, casually dressed and groomed   Behavior: pleasant and cooperative   Psychomotor Activity: no psychomotor agitation or retardation noted   Eye Contact: fair  Speech: normal amount, volume and fluency    Mood: depressed  Affect: congruent  Thought Process: linear, goal directed, no circumstantial or tangential thought process noted, no racing thoughts or flight of ideas  Descriptions of Associations: intact   Thought Content Hallucinations: denies AH, VH , does not appear responding to stimuli  Delusions: no paranoia, delusions of control, grandeur, ideas of reference, thought broadcasting, and magical thinking  Suicidal Thoughts: had some fleeting thoughts of passive SI  Homicidal Thoughts: denies HI, intention, plan   Alertness/Orientation: alert and fully oriented   Insight: fair Judgment: fair  Memory: intact   Executive Functions  Concentration: intact  Attention Span: fair  Recall: intact  Fund of Knowledge: fair   Physical Exam  General: Pleasant, well-appearing . No acute distress. Pulmonary: Normal effort. No wheezing or rales. Skin: No obvious rash or lesions. Neuro: A&Ox3.No focal deficit.  Review of Systems  No reported  symptoms   Screenings:  AIMS    Flowsheet Row [facility]  AIMS Total Score 0   AUDIT    Flowsheet Row Ad[facility] ([facility]  Alcohol Use Disorder Identification Test Final Score (AUDIT) 2   GAD-7    Flowsheet Row Counselor from 04/05/2024 in [facility] from 03/22/2024 in [facility] at [facility] from 02/16/2024 in [facility] at [facility] from 01/01/2024 in [facility] at [facility] from 12/19/2023 in [facility] at [facility]  Total GAD-7 Score 5 5 7 5 4    PHQ2-9    Flowsheet Row Counselor from 04/05/2024 in [facility] at [facility] from 03/22/2024 in [facility] at [facility] from 02/16/2024 in [facility] at [facility] from 01/01/2024 in [facility] at [facility] from 12/19/2023 in [facility] at [facility] Total Score 2 2 2 1 3   PHQ-9 Total Score 5 8 11 4 8    Flowsheet Row UC from 08/17/2024 in [facility] at [facility] from 02/16/2024 in [facility] at [place] [facility] from 01/08/2024 in [facility] Urgent Care at [facility] RISK CATEGORY No Risk Low Risk No Risk    Collaboration of Care: Case discussed with current outpatient attending, see attending's attestation for additional information  Ismael Franco, MD PGY-3 Psychiatry Resident

## 2024-09-23 ENCOUNTER — Other Ambulatory Visit: Payer: Self-pay

## 2024-09-23 ENCOUNTER — Telehealth (HOSPITAL_COMMUNITY): Payer: MEDICAID | Admitting: Psychiatry

## 2024-09-23 DIAGNOSIS — F331 Major depressive disorder, recurrent, moderate: Secondary | ICD-10-CM

## 2024-09-23 DIAGNOSIS — F411 Generalized anxiety disorder: Secondary | ICD-10-CM

## 2024-09-23 MED ORDER — ESCITALOPRAM OXALATE 10 MG PO TABS
15.0000 mg | ORAL_TABLET | Freq: Every day | ORAL | 1 refills | Status: AC
  Filled 2024-09-23: qty 60, 40d supply, fill #0
  Filled 2024-10-02: qty 45, 30d supply, fill #0

## 2024-09-23 MED ORDER — HYDROXYZINE HCL 10 MG PO TABS
10.0000 mg | ORAL_TABLET | Freq: Three times a day (TID) | ORAL | 0 refills | Status: AC | PRN
  Filled 2024-09-23: qty 90, 30d supply, fill #0

## 2024-09-23 NOTE — Patient Instructions (Signed)
 For [facility] residents who are uninsured or have Medicare/Medicaid:  Please come to [facility] (this facility) during walk in hours for appointment with psychiatrist for further medication management and for therapists for therapy.    Walk in hours are 8-11 AM Monday through Thursday for medication management.Child and adolescent psychiatrists are only available on Wednesdays and Thursdays during walk in hours.  Therapy walk in hours are Monday-Wednesday 8 AM-1PM.   It is first come, first -serve; it is best to arrive by 7:00 AM.   On Friday from 1 pm to 4 pm for therapy intake only. Please arrive by 12:00 pm as it is  first come, first -serve.    When you arrive please go upstairs for your appointment. If you are unsure of where to go, inform the front desk that you are here for a walk in appointment and they will assist you with directions upstairs.  Address:  [address] in [place], [address] Ph: [phone]

## 2024-09-24 NOTE — Addendum Note (Signed)
 Addended by: CARVIN CROCK on: 09/24/2024 08:01 AM   Modules accepted: Level of Service

## 2024-10-02 ENCOUNTER — Other Ambulatory Visit: Payer: Self-pay

## 2024-10-03 ENCOUNTER — Other Ambulatory Visit: Payer: Self-pay

## 2024-11-11 ENCOUNTER — Telehealth (HOSPITAL_COMMUNITY): Payer: MEDICAID | Admitting: Psychiatry
